# Patient Record
Sex: Female | Born: 1950
Health system: Southern US, Community
[De-identification: ages and names within clinical notes are randomized; demographics above are authoritative.]

## PROBLEM LIST (undated history)

## (undated) DIAGNOSIS — K219 Gastro-esophageal reflux disease without esophagitis: Secondary | ICD-10-CM

## (undated) DIAGNOSIS — E785 Hyperlipidemia, unspecified: Secondary | ICD-10-CM

## (undated) DIAGNOSIS — I080 Rheumatic disorders of both mitral and aortic valves: Secondary | ICD-10-CM

## (undated) DIAGNOSIS — K449 Diaphragmatic hernia without obstruction or gangrene: Secondary | ICD-10-CM

## (undated) DIAGNOSIS — R87619 Unspecified abnormal cytological findings in specimens from cervix uteri: Secondary | ICD-10-CM

## (undated) DIAGNOSIS — M8580 Other specified disorders of bone density and structure, unspecified site: Secondary | ICD-10-CM

## (undated) DIAGNOSIS — D126 Benign neoplasm of colon, unspecified: Secondary | ICD-10-CM

## (undated) DIAGNOSIS — Z923 Personal history of irradiation: Secondary | ICD-10-CM

## (undated) DIAGNOSIS — I341 Nonrheumatic mitral (valve) prolapse: Secondary | ICD-10-CM

## (undated) DIAGNOSIS — I10 Essential (primary) hypertension: Secondary | ICD-10-CM

## (undated) DIAGNOSIS — K5790 Diverticulosis of intestine, part unspecified, without perforation or abscess without bleeding: Secondary | ICD-10-CM

## (undated) DIAGNOSIS — I6529 Occlusion and stenosis of unspecified carotid artery: Secondary | ICD-10-CM

## (undated) DIAGNOSIS — C50919 Malignant neoplasm of unspecified site of unspecified female breast: Secondary | ICD-10-CM

## (undated) DIAGNOSIS — I351 Nonrheumatic aortic (valve) insufficiency: Secondary | ICD-10-CM

## (undated) DIAGNOSIS — E079 Disorder of thyroid, unspecified: Secondary | ICD-10-CM

## (undated) DIAGNOSIS — K760 Fatty (change of) liver, not elsewhere classified: Secondary | ICD-10-CM

## (undated) HISTORY — DX: Benign neoplasm of colon, unspecified: D12.6

## (undated) HISTORY — DX: Nonrheumatic aortic (valve) insufficiency: I35.1

## (undated) HISTORY — DX: Diverticulosis of intestine, part unspecified, without perforation or abscess without bleeding: K57.90

## (undated) HISTORY — PX: CRYOTHERAPY: SHX1416

## (undated) HISTORY — DX: Essential (primary) hypertension: I10

## (undated) HISTORY — DX: Malignant neoplasm of unspecified site of unspecified female breast: C50.919

## (undated) HISTORY — DX: Rheumatic disorders of both mitral and aortic valves: I08.0

## (undated) HISTORY — DX: Disorder of thyroid, unspecified: E07.9

## (undated) HISTORY — DX: Hyperlipidemia, unspecified: E78.5

## (undated) HISTORY — DX: Nonrheumatic mitral (valve) prolapse: I34.1

## (undated) HISTORY — DX: Diaphragmatic hernia without obstruction or gangrene: K44.9

## (undated) HISTORY — DX: Fatty (change of) liver, not elsewhere classified: K76.0

## (undated) HISTORY — DX: Occlusion and stenosis of unspecified carotid artery: I65.29

## (undated) HISTORY — DX: Personal history of irradiation: Z92.3

## (undated) HISTORY — DX: Unspecified abnormal cytological findings in specimens from cervix uteri: R87.619

## (undated) HISTORY — DX: Other specified disorders of bone density and structure, unspecified site: M85.80

## (undated) HISTORY — PX: PELVIC LAPAROSCOPY: SHX162

## (undated) HISTORY — DX: Gastro-esophageal reflux disease without esophagitis: K21.9

---

## 1997-08-15 ENCOUNTER — Other Ambulatory Visit: Admission: RE | Admit: 1997-08-15 | Discharge: 1997-08-15 | Payer: Self-pay | Admitting: Obstetrics and Gynecology

## 1998-08-14 ENCOUNTER — Other Ambulatory Visit: Admission: RE | Admit: 1998-08-14 | Discharge: 1998-08-14 | Payer: Self-pay | Admitting: Obstetrics and Gynecology

## 1999-10-26 ENCOUNTER — Encounter (INDEPENDENT_AMBULATORY_CARE_PROVIDER_SITE_OTHER): Payer: Self-pay

## 1999-10-26 ENCOUNTER — Other Ambulatory Visit: Admission: RE | Admit: 1999-10-26 | Discharge: 1999-10-26 | Payer: Self-pay | Admitting: Obstetrics and Gynecology

## 2000-01-11 ENCOUNTER — Encounter: Payer: Self-pay | Admitting: Obstetrics and Gynecology

## 2000-01-11 ENCOUNTER — Encounter: Admission: RE | Admit: 2000-01-11 | Discharge: 2000-01-11 | Payer: Self-pay | Admitting: Obstetrics and Gynecology

## 2000-01-27 ENCOUNTER — Other Ambulatory Visit: Admission: RE | Admit: 2000-01-27 | Discharge: 2000-01-27 | Payer: Self-pay | Admitting: Obstetrics and Gynecology

## 2000-06-02 ENCOUNTER — Other Ambulatory Visit: Admission: RE | Admit: 2000-06-02 | Discharge: 2000-06-02 | Payer: Self-pay | Admitting: Obstetrics and Gynecology

## 2000-10-27 ENCOUNTER — Other Ambulatory Visit: Admission: RE | Admit: 2000-10-27 | Discharge: 2000-10-27 | Payer: Self-pay | Admitting: Obstetrics and Gynecology

## 2001-11-02 ENCOUNTER — Other Ambulatory Visit: Admission: RE | Admit: 2001-11-02 | Discharge: 2001-11-02 | Payer: Self-pay | Admitting: Obstetrics and Gynecology

## 2003-01-17 ENCOUNTER — Encounter: Admission: RE | Admit: 2003-01-17 | Discharge: 2003-01-17 | Payer: Self-pay | Admitting: Obstetrics and Gynecology

## 2003-01-17 ENCOUNTER — Encounter: Payer: Self-pay | Admitting: Obstetrics and Gynecology

## 2003-01-23 ENCOUNTER — Other Ambulatory Visit: Admission: RE | Admit: 2003-01-23 | Discharge: 2003-01-23 | Payer: Self-pay | Admitting: Obstetrics and Gynecology

## 2004-03-18 ENCOUNTER — Encounter: Admission: RE | Admit: 2004-03-18 | Discharge: 2004-03-18 | Payer: Self-pay | Admitting: Obstetrics and Gynecology

## 2004-03-25 ENCOUNTER — Other Ambulatory Visit: Admission: RE | Admit: 2004-03-25 | Discharge: 2004-03-25 | Payer: Self-pay | Admitting: Obstetrics and Gynecology

## 2005-05-20 ENCOUNTER — Other Ambulatory Visit: Admission: RE | Admit: 2005-05-20 | Discharge: 2005-05-20 | Payer: Self-pay | Admitting: Obstetrics and Gynecology

## 2005-07-21 ENCOUNTER — Encounter: Admission: RE | Admit: 2005-07-21 | Discharge: 2005-07-21 | Payer: Self-pay | Admitting: Obstetrics and Gynecology

## 2005-08-08 ENCOUNTER — Encounter: Admission: RE | Admit: 2005-08-08 | Discharge: 2005-08-08 | Payer: Self-pay | Admitting: Obstetrics and Gynecology

## 2006-02-02 ENCOUNTER — Ambulatory Visit: Payer: Self-pay | Admitting: Internal Medicine

## 2006-02-10 ENCOUNTER — Encounter: Admission: RE | Admit: 2006-02-10 | Discharge: 2006-02-10 | Payer: Self-pay | Admitting: Obstetrics and Gynecology

## 2006-07-27 ENCOUNTER — Other Ambulatory Visit: Admission: RE | Admit: 2006-07-27 | Discharge: 2006-07-27 | Payer: Self-pay | Admitting: Obstetrics and Gynecology

## 2007-03-08 ENCOUNTER — Encounter: Admission: RE | Admit: 2007-03-08 | Discharge: 2007-03-08 | Payer: Self-pay | Admitting: Obstetrics and Gynecology

## 2007-09-20 ENCOUNTER — Other Ambulatory Visit: Admission: RE | Admit: 2007-09-20 | Discharge: 2007-09-20 | Payer: Self-pay | Admitting: Obstetrics and Gynecology

## 2007-09-20 ENCOUNTER — Encounter: Admission: RE | Admit: 2007-09-20 | Discharge: 2007-09-20 | Payer: Self-pay | Admitting: Obstetrics and Gynecology

## 2008-01-18 ENCOUNTER — Ambulatory Visit: Payer: Self-pay | Admitting: Obstetrics and Gynecology

## 2008-04-18 DIAGNOSIS — C50919 Malignant neoplasm of unspecified site of unspecified female breast: Secondary | ICD-10-CM

## 2008-04-18 HISTORY — PX: BREAST LUMPECTOMY: SHX2

## 2008-04-18 HISTORY — PX: BREAST SURGERY: SHX581

## 2008-04-18 HISTORY — DX: Malignant neoplasm of unspecified site of unspecified female breast: C50.919

## 2008-11-03 ENCOUNTER — Encounter: Admission: RE | Admit: 2008-11-03 | Discharge: 2008-11-03 | Payer: Self-pay | Admitting: Obstetrics and Gynecology

## 2008-11-04 ENCOUNTER — Encounter: Payer: Self-pay | Admitting: Obstetrics and Gynecology

## 2008-11-04 ENCOUNTER — Ambulatory Visit: Payer: Self-pay | Admitting: Obstetrics and Gynecology

## 2008-11-04 ENCOUNTER — Other Ambulatory Visit: Admission: RE | Admit: 2008-11-04 | Discharge: 2008-11-04 | Payer: Self-pay | Admitting: Obstetrics and Gynecology

## 2008-11-05 ENCOUNTER — Encounter (INDEPENDENT_AMBULATORY_CARE_PROVIDER_SITE_OTHER): Payer: Self-pay | Admitting: Diagnostic Radiology

## 2008-11-05 ENCOUNTER — Encounter: Admission: RE | Admit: 2008-11-05 | Discharge: 2008-11-05 | Payer: Self-pay | Admitting: Obstetrics and Gynecology

## 2008-11-11 ENCOUNTER — Encounter: Admission: RE | Admit: 2008-11-11 | Discharge: 2008-11-11 | Payer: Self-pay | Admitting: Obstetrics and Gynecology

## 2008-11-14 ENCOUNTER — Ambulatory Visit: Payer: Self-pay | Admitting: Oncology

## 2008-11-17 ENCOUNTER — Ambulatory Visit: Admission: RE | Admit: 2008-11-17 | Discharge: 2008-12-30 | Payer: Self-pay | Admitting: Radiation Oncology

## 2008-11-18 ENCOUNTER — Encounter: Payer: Self-pay | Admitting: Internal Medicine

## 2008-11-25 ENCOUNTER — Encounter: Admission: RE | Admit: 2008-11-25 | Discharge: 2008-11-25 | Payer: Self-pay | Admitting: Surgery

## 2008-11-27 ENCOUNTER — Encounter (INDEPENDENT_AMBULATORY_CARE_PROVIDER_SITE_OTHER): Payer: Self-pay | Admitting: Surgery

## 2008-11-27 ENCOUNTER — Ambulatory Visit (HOSPITAL_BASED_OUTPATIENT_CLINIC_OR_DEPARTMENT_OTHER): Admission: RE | Admit: 2008-11-27 | Discharge: 2008-11-27 | Payer: Self-pay | Admitting: Surgery

## 2008-11-27 ENCOUNTER — Encounter: Admission: RE | Admit: 2008-11-27 | Discharge: 2008-11-27 | Payer: Self-pay | Admitting: Surgery

## 2008-12-15 ENCOUNTER — Encounter: Payer: Self-pay | Admitting: Internal Medicine

## 2008-12-19 ENCOUNTER — Ambulatory Visit: Payer: Self-pay | Admitting: Oncology

## 2008-12-24 ENCOUNTER — Encounter: Payer: Self-pay | Admitting: Internal Medicine

## 2009-01-22 ENCOUNTER — Ambulatory Visit: Payer: Self-pay | Admitting: Oncology

## 2009-01-22 ENCOUNTER — Encounter: Payer: Self-pay | Admitting: Internal Medicine

## 2009-01-30 ENCOUNTER — Ambulatory Visit: Payer: Self-pay | Admitting: Internal Medicine

## 2009-02-13 ENCOUNTER — Encounter: Payer: Self-pay | Admitting: Internal Medicine

## 2009-02-13 ENCOUNTER — Ambulatory Visit: Payer: Self-pay | Admitting: Internal Medicine

## 2009-02-16 ENCOUNTER — Encounter: Payer: Self-pay | Admitting: Internal Medicine

## 2009-06-03 ENCOUNTER — Encounter: Admission: RE | Admit: 2009-06-03 | Discharge: 2009-06-03 | Payer: Self-pay | Admitting: Radiation Oncology

## 2009-11-12 ENCOUNTER — Encounter: Admission: RE | Admit: 2009-11-12 | Discharge: 2009-11-12 | Payer: Self-pay | Admitting: Surgery

## 2009-11-16 ENCOUNTER — Ambulatory Visit: Payer: Self-pay | Admitting: Obstetrics and Gynecology

## 2009-11-16 ENCOUNTER — Other Ambulatory Visit: Admission: RE | Admit: 2009-11-16 | Discharge: 2009-11-16 | Payer: Self-pay | Admitting: Obstetrics and Gynecology

## 2009-12-17 ENCOUNTER — Ambulatory Visit: Payer: Self-pay | Admitting: Obstetrics and Gynecology

## 2010-05-08 ENCOUNTER — Other Ambulatory Visit: Payer: Self-pay | Admitting: Radiation Oncology

## 2010-05-08 DIAGNOSIS — Z9889 Other specified postprocedural states: Secondary | ICD-10-CM

## 2010-05-09 ENCOUNTER — Encounter: Payer: Self-pay | Admitting: Obstetrics and Gynecology

## 2010-07-24 LAB — COMPREHENSIVE METABOLIC PANEL
Alkaline Phosphatase: 56 U/L (ref 39–117)
Calcium: 10 mg/dL (ref 8.4–10.5)
Chloride: 103 mEq/L (ref 96–112)
Creatinine, Ser: 0.86 mg/dL (ref 0.4–1.2)
GFR calc Af Amer: >60 mL/min (ref >60)
Total Bilirubin: 0.8 mg/dL (ref 0.3–1.2)

## 2010-07-24 LAB — CBC
MCHC: 34 g/dL (ref 30.0–36.0)
MCV: 97 fL (ref 78.0–100.0)
WBC: 5.7 10*3/uL (ref 4.0–10.5)

## 2010-07-24 LAB — URINALYSIS, ROUTINE W REFLEX MICROSCOPIC
Bilirubin Urine: NEGATIVE
Glucose, UA: NEGATIVE mg/dL
Hgb urine dipstick: NEGATIVE
Specific Gravity, Urine: 1.006 (ref 1.005–1.030)

## 2010-07-24 LAB — DIFFERENTIAL
Basophils Relative: 0 % (ref 0–1)
Eosinophils Relative: 4 % (ref 0–5)
Lymphocytes Relative: 40 % (ref 12–46)
Neutro Abs: 2.7 10*3/uL (ref 1.7–7.7)
Neutrophils Relative %: 48 % (ref 43–77)

## 2010-08-31 NOTE — Op Note (Signed)
NAME:  Brooke Wilkinson, Brooke Wilkinson             ACCOUNT NO.:  [id number]   MEDICAL RECORD NO.:  [id number]          PATIENT TYPE:  AMB   LOCATION:  DSC                          FACILITY:  [facility]   PHYSICIAN:  Currie Paris, M.D.DATE OF BIRTH:  1950-07-04   DATE OF PROCEDURE:  11/27/2008  DATE OF DISCHARGE:                               OPERATIVE REPORT   PREOPERATIVE DIAGNOSIS:  Carcinoma, right breast, upper outer quadrant,  clinical stage I.   POSTOPERATIVE DIAGNOSIS:  Carcinoma, right breast, upper outer quadrant,  clinical stage I.   OPERATION:  Needle-guided right lumpectomy with blue dye injection,  sentinel lymph node biopsy, and MammoSite CED placement.   SURGEON:  Currie Paris, MD   ANESTHESIA:  General.   CLINICAL HISTORY:  This is a 60 year old lady who wished to have a  lumpectomy for her newly diagnosed right breast cancer.  She is not a  candidate for possible postoperative MammoSite radiation, so we plan to  put a MammoSite CED and it appeared appropriate after the lumpectomy and  sentinel node evaluation.   DESCRIPTION OF PROCEDURE:  I saw the patient in the holding area and she  had no further questions.  We confirmed the surgery as noted above.  I  marked the right breast as the operative side.  I reviewed the mammogram  with guidewire placement films.   The patient was then taken to the operating room and after satisfactory  general anesthesia had been obtained, the time-out was done.  I then  injected 5 mL of dilute methylene blue subareolarly and massaged that  in.  A full prep and drape of the right breast was then done.   Using the Neoprobe, I identified a hot area in the axilla and made a  transverse incision and entered the axilla proper.  Medially, I saw a  bright blue lymph node with a tube right lymphatic blue lymphatics  leading into it and excised this.  It had counts of 6000.  Once that was  out, I could identify no other hot area, see no other  blue lymphatics  other than the ones that had been coming into.  The node removed and  palpate no abnormal lymph nodes.  Moist pack was placed while we waited  for pathology to look at the lymph node.   The guidewire was noted to go from about the 12 o'clock position and  tracked somewhat inferiorly and somewhat laterally.  I made a  curvilinear incision about a centimeter and half below the guidewire  entry site and divided about a centimeter of breast tissue and then  manipulated the guidewire into the wound.  I then took a wide excision  around the tract of the guidewire and as I was getting towards the tip  of the guidewire, I could palpate that.  I had gotten close to the tumor  along the inferior medial aspect of the lumpectomy.  I had backed up a  little bit and taken a little bit extra, but did expose the tumor as I  was coming around this area.   Once the specimen  was out, I inked it and we did a specimen mammogram  showing that the clip was in the area where I had gotten close.   I went back at this point and took an additional approximately 1 cm  thick piece of tissue directly adjacent to where the tumor had been  palpated close to the margin of the primary excision.  I therefore  believe that if this re-excision has a good negative margin, then unless  there is tumor other than the palpably abnormal area on the main  specimen, we should have adequate margins.   I made sure everything was dry.  I put some Marcaine in to help with  postop analgesia.   At this point, Dr. Colonel Bald reported that the lymph node was negative.  I  then checked that area for hemostasis, put some Marcaine in and closed  in layers with 3-0 Vicryl and 4-0 Monocryl subcuticular and at the end,  Dermabond.   I then made a small incision laterally in the skin and put the MammoSite  trocar through and into the site of the lumpectomy.  I easily threaded  the CED in.  This seemed to inflate nicely and I  thought would fill the  cavity.   The balloon was then deflated and the cavity closed in several layers  with 3-0 Vicryl to close the subcu with 4-0 Monocryl subcuticular.  I  then reinflated the balloon to 40 mL and it seemed to have good filling  of the cavity and about an 8 mm skin to balloon margin.   At this point, sterile dressings were applied.  The patient tolerated  the procedure well.  There were no operative complications.  All counts  were correct.      Currie Paris, M.D.  Electronically Signed     CJS/MEDQ  D:  11/27/2008  T:  11/27/2008  Job:  [id number]   cc:   Margy Clarks, MD  Rande Brunt. Eda Paschal, M.D.

## 2010-09-03 NOTE — Assessment & Plan Note (Signed)
[facility]                              CARDIOLOGY OFFICE NOTE   NAME:Naves, HOLLEIGH CRIHFIELD                    MRN:          [id number]  DATE:02/02/2006                            DOB:          [birth year]    IDENTIFICATION:  Ms. Char is a 60 year old who is referred for  dyslipidemia. She has no known history of coronary artery disease.   NOTE: The patient had a Cardiolite scan done in 2001 that showed normal  profusion, EF greater than 70%.   She has been known to have abnormal lipids. Note back in 2001, her LDL  actually was 137, HDL was 54. This was by Dr. Alwyn Ren, but then she has had  much higher levels since. The most recent value: Total cholesterol 252, HDL  of 48, LDL of 180 (February 2007).   On talking to the patient, she says for breakfast she will have a bran  muffin, low-fat yogurt; lunch is potato, sandwich; dinner is [employer].   She does not exercise much. She knows she should.   CURRENT MEDICATIONS:  1. Climara Pro patch.  2. Multivitamin.   ALLERGIES:  1. PENICILLIN.  2. ERYTHROMYCIN.  3. OTHER ANTIBIOTICS.   FAMILY HISTORY:  Father had a history of congestive heart failure, but was  an alcoholic. Mother had diabetes and diabetic complications and died of  TAD. Two sisters, both with increased cholesterol.   PAST MEDICAL HISTORY:  1. Allergies.  2. Dyslipidemia.   SOCIAL HISTORY:  The patient does not smoke, does not drink.   REVIEW OF SYSTEMS:  All systems reviewed. Noted occasional irregular pulse  in 2001, otherwise negative with the above problem except as noted.   PHYSICAL EXAMINATION:  The patient is in no distress. Blood pressure:  122/60. Pulse: 94. Weight: 122.  NECK: No bruits.  LUNGS:  Clear.  CARDIAC: Regular rate and rhythm, S1, S2. No S3, S4 or murmurs.  ABDOMEN: Benign.  EXTREMITIES: No edema.   12-lead EKG: Normal sinus rhythm, 94 beats per minute.   IMPRESSION:  Ms. Rosier is a  60 year old woman with dyslipidemia. Looking  at her diet, she actually eats fairly well.   She is very reluctant to consider medical therapy. I think it is not  unreasonable to get her exercising. I also would recommend benichol spread q  day to see if she can decrease cholesterol absorption.   She may indeed need a statin and  I have told her this. Again, with her age  she is about 1-2% per ten year risk for a cardiac event. I gave her the  American Heart Association site as well as Framingham profile.   I would like to have the patient begin her modification now with increased  exercise, diet control, Benichol and I would be happy to check lipid panels  in about eight months time to see how she is doing. I will see her back  after.    ______________________________  Pricilla Riffle, MD, [facility]    PVR/MedQ  DD: 02/06/2006  DT: 02/06/2006  Job #: [id number]   cc:  Daniel L. Eda Paschal, M.D.

## 2010-11-15 ENCOUNTER — Ambulatory Visit
Admission: RE | Admit: 2010-11-15 | Discharge: 2010-11-15 | Disposition: A | Discharge status: Home / Self Care | Payer: Commercial Indemnity | Source: Ambulatory Visit | Attending: Radiation Oncology | Admitting: Radiation Oncology

## 2010-11-15 DIAGNOSIS — Z9889 Other specified postprocedural states: Secondary | ICD-10-CM

## 2010-12-08 ENCOUNTER — Ambulatory Visit
Admission: RE | Admit: 2010-12-08 | Discharge: 2010-12-08 | Disposition: A | Discharge status: Home / Self Care | Payer: Commercial Indemnity | Source: Ambulatory Visit | Attending: Radiation Oncology | Admitting: Radiation Oncology

## 2010-12-08 ENCOUNTER — Other Ambulatory Visit: Payer: Self-pay | Admitting: Radiation Oncology

## 2010-12-08 DIAGNOSIS — Z853 Personal history of malignant neoplasm of breast: Secondary | ICD-10-CM

## 2010-12-08 DIAGNOSIS — Z9889 Other specified postprocedural states: Secondary | ICD-10-CM

## 2011-01-31 IMAGING — MG MM SCREEN MAMMOGRAM BILATERAL
4 series · 4 of 4 positions shown · non-contrast
Comparison: none

DG SCREEN MAMMOGRAM BILATERAL
Bilateral CC and MLO view(s) were taken.
Technologist: Arjold Sahatit

DIGITAL SCREENING MAMMOGRAM WITH CAD:
The breast tissue is heterogeneously dense.  A possible mass is noted in the right breast.  Spot 
compression views and possibly sonography are recommended for further evaluation.  In the left 
breast, no masses or malignant type calcifications are identified.  Compared with prior studies.
Images were processed with CAD.

[R CC]
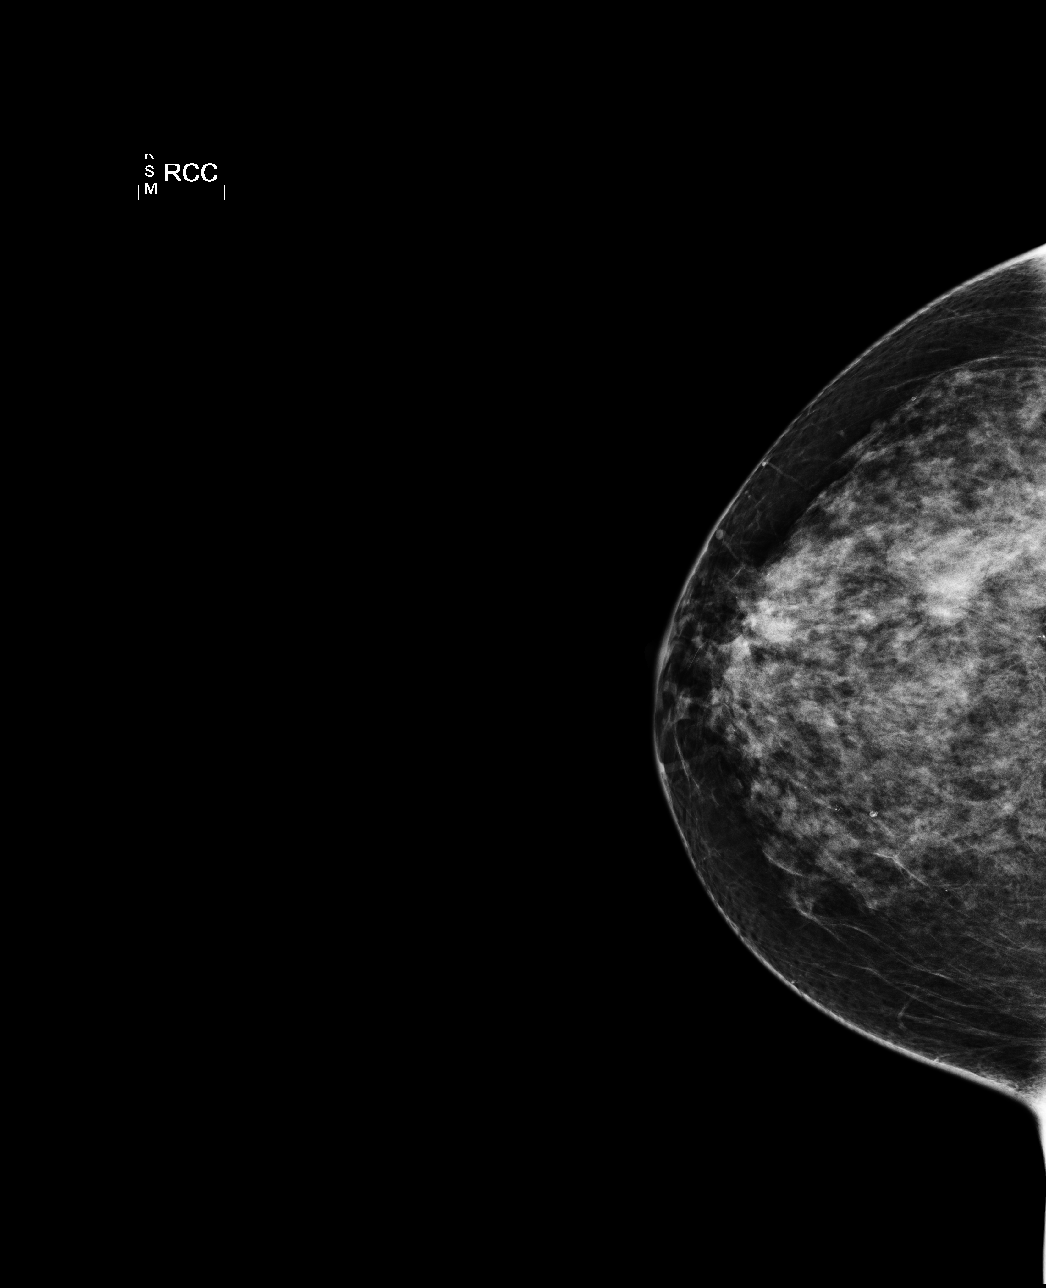

[L CC]
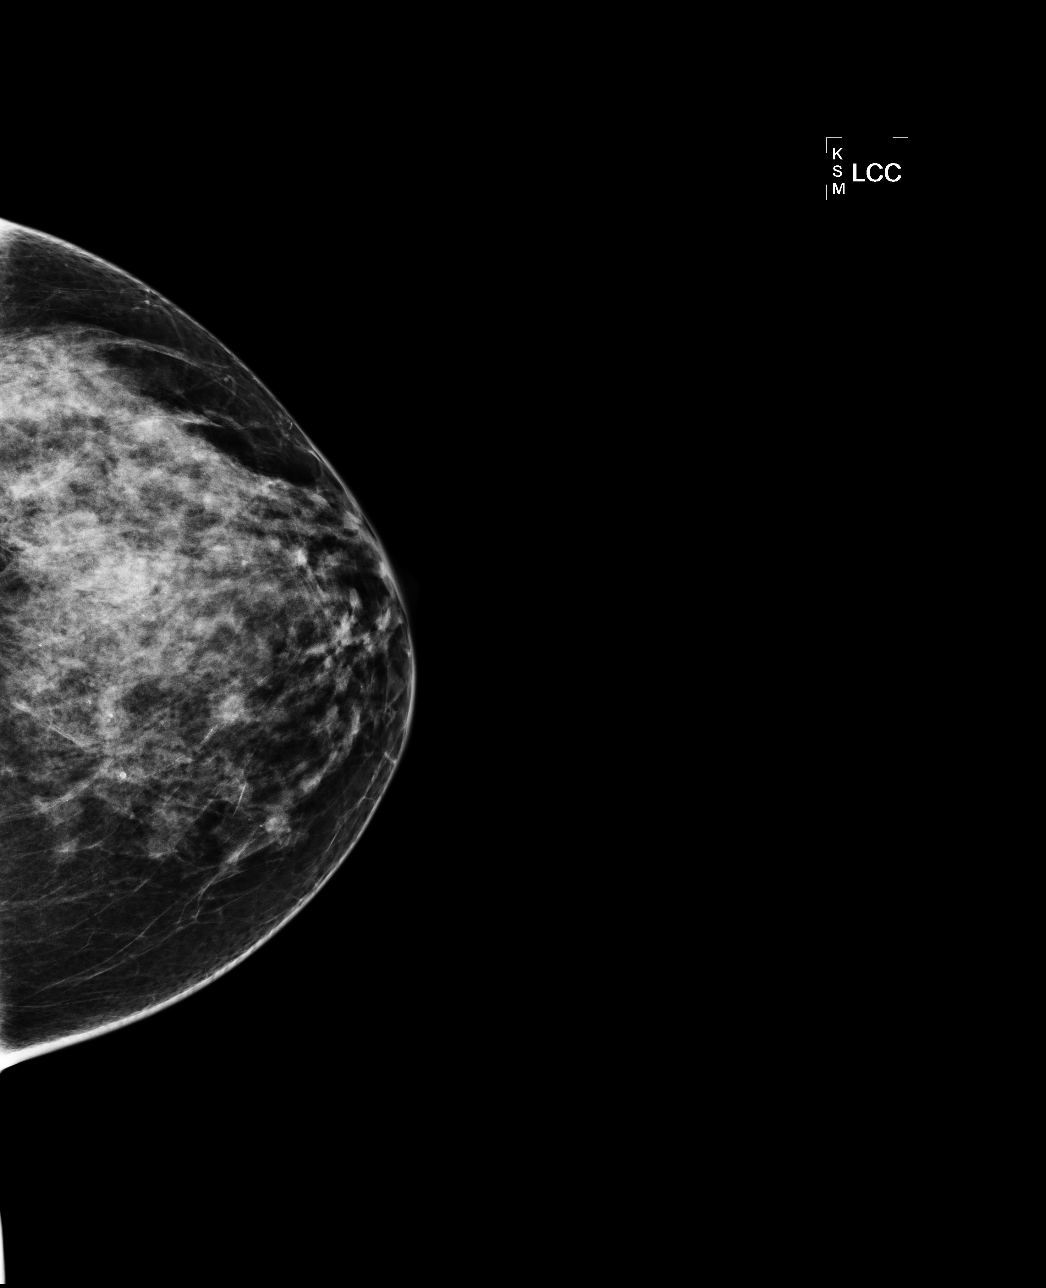

[L MLO]
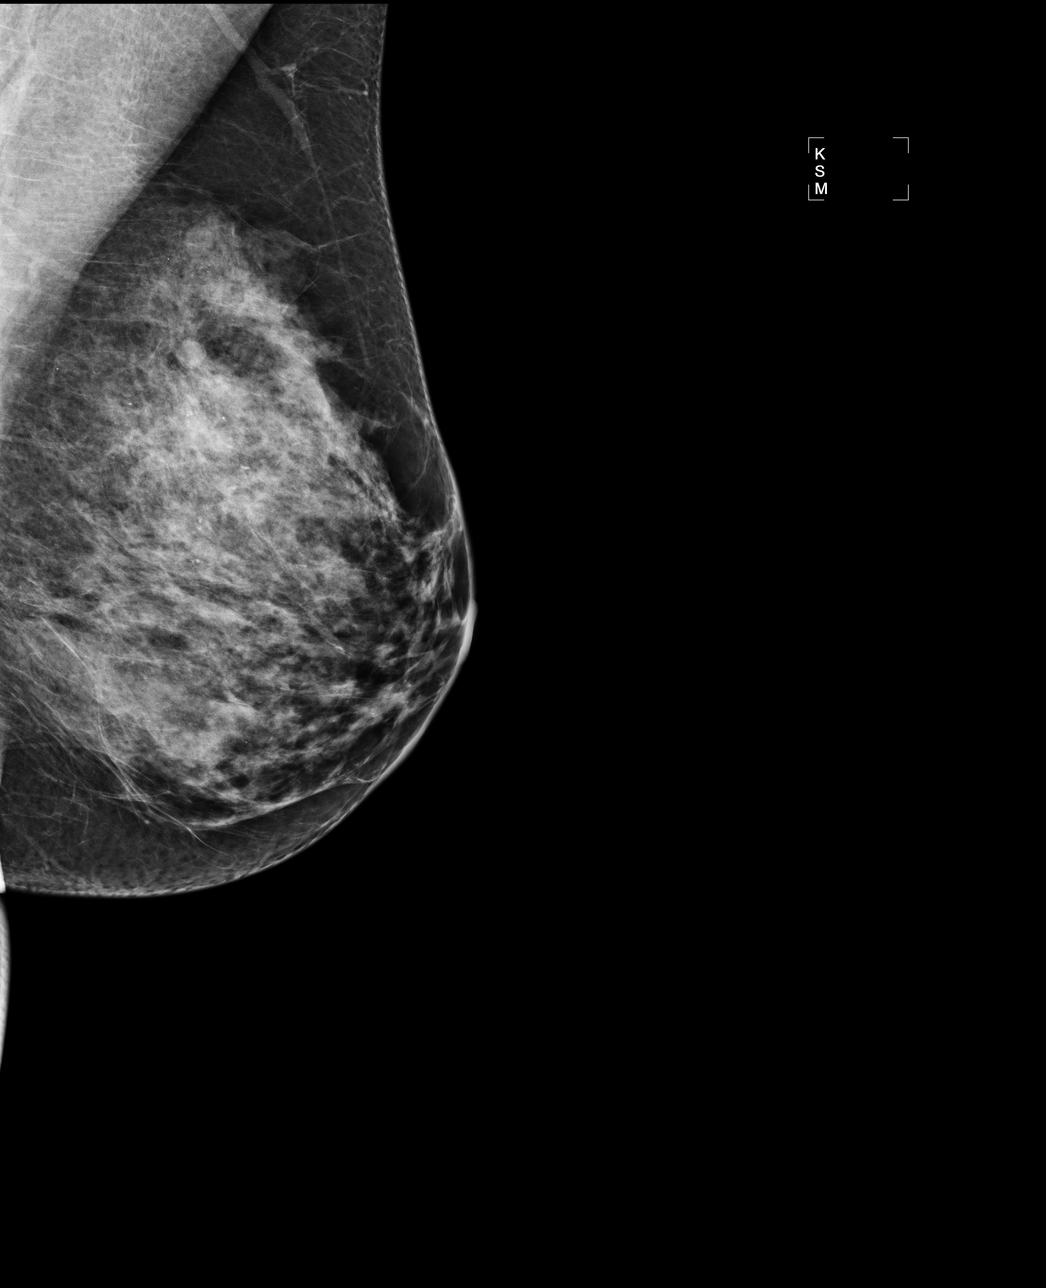

[R MLO]
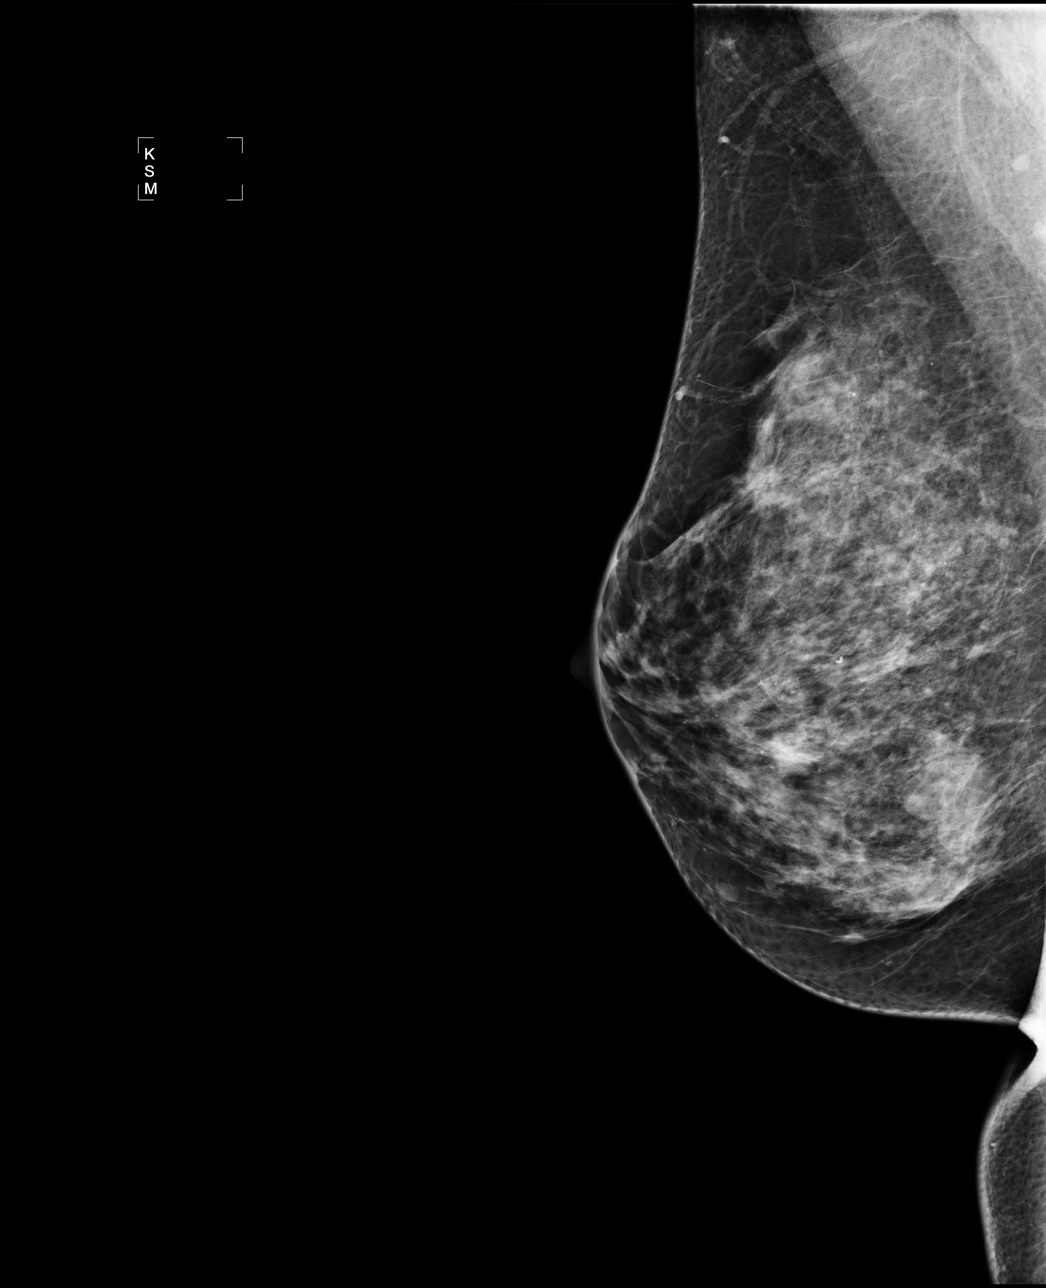

[4 of 4 positions shown; findings below may reference images not displayed]

IMPRESSION: Possible mass, right breast.  Additional evaluation is indicated.  The patient will be contacted 
for additional studies and a supplementary report will follow.  No specific mammographic evidence 
of malignancy, left breast.

ASSESSMENT: Need additional imaging evaluation and/or prior mammograms for comparison - BI-RADS 0

Further imaging of the right breast.
ANALYZED BY COMPUTER AIDED DETECTION. , THIS PROCEDURE WAS A DIGITAL MAMMOGRAM.

## 2011-02-02 IMAGING — US US BREAST R
1 series · 8 of 8 positions shown · non-contrast
Comparison: With priors

CLINICAL DATA: Abnormal right screening mammogram

DIGITAL DIAGNOSTIC  RIGHT  MAMMOGRAM   AND RIGHT BREAST
ULTRASOUND:

[Series 1: us breast right · 8 of 8 slices shown]
[im 1/8]
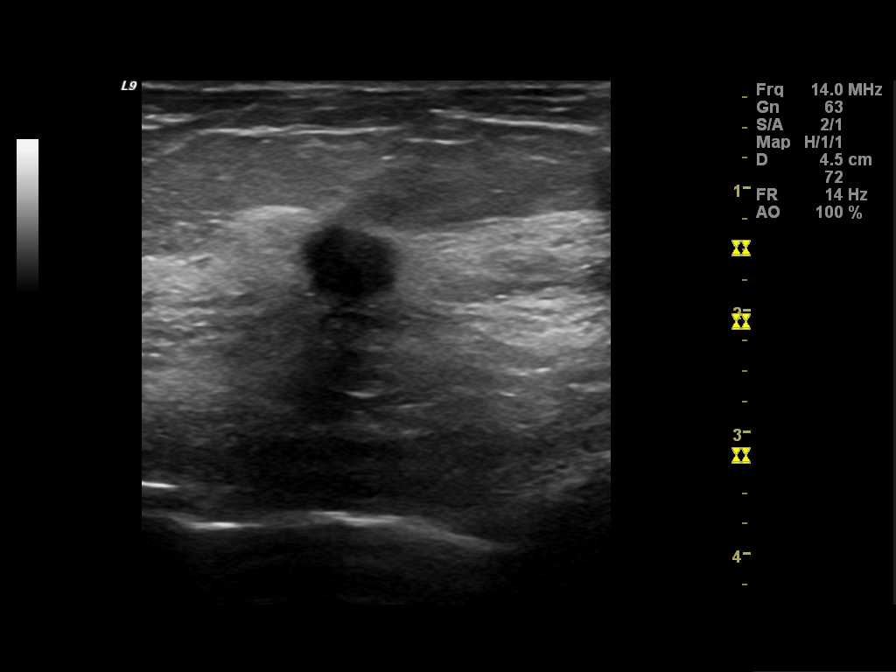
[im 2/8]
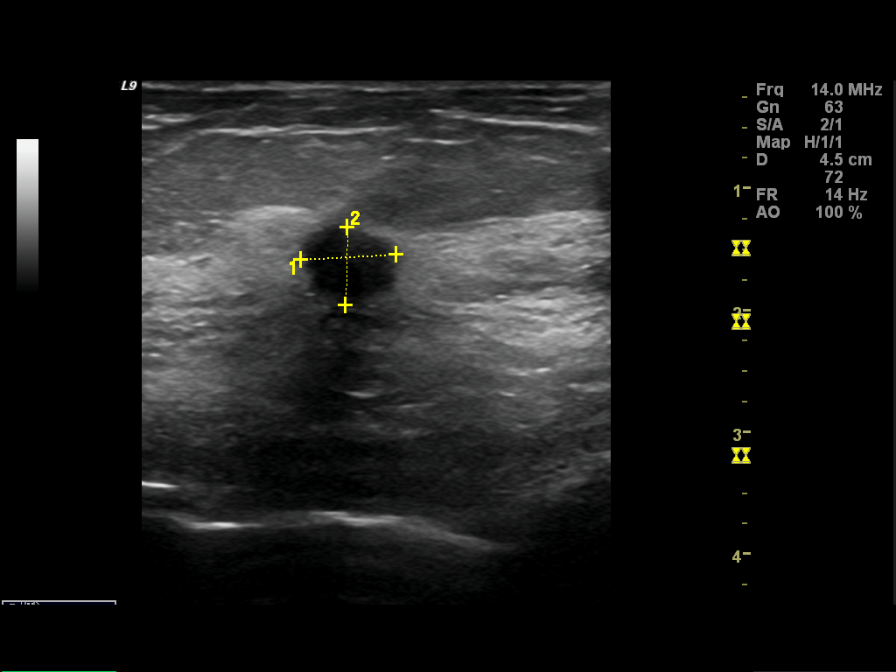
[im 3/8]
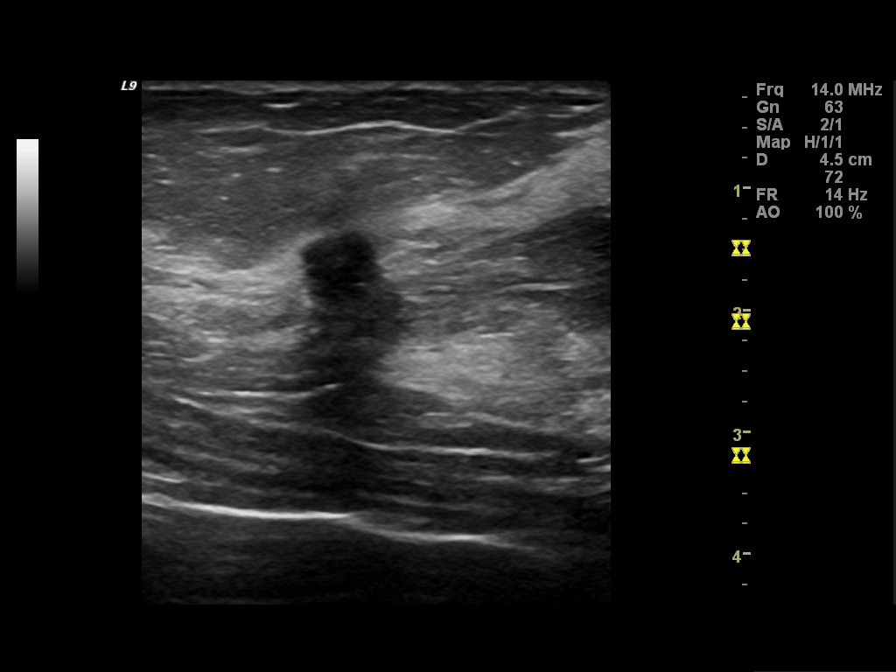
[im 4/8]
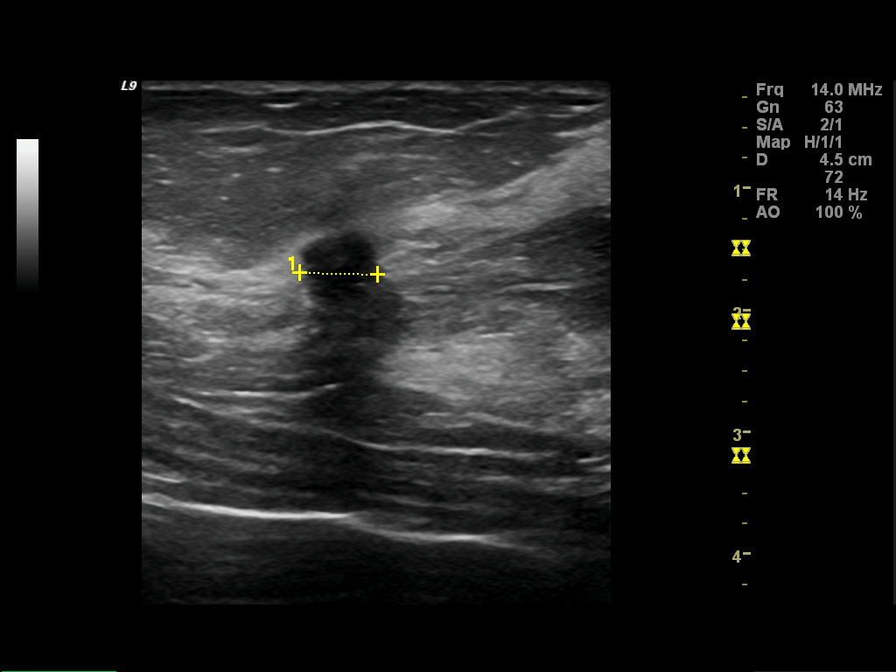
[im 5/8]
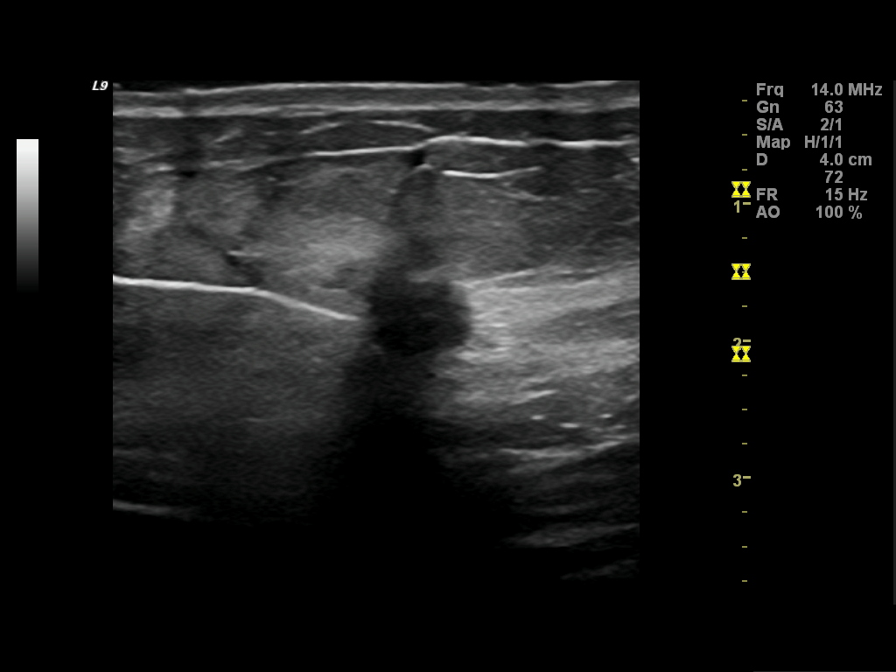
[im 6/8]
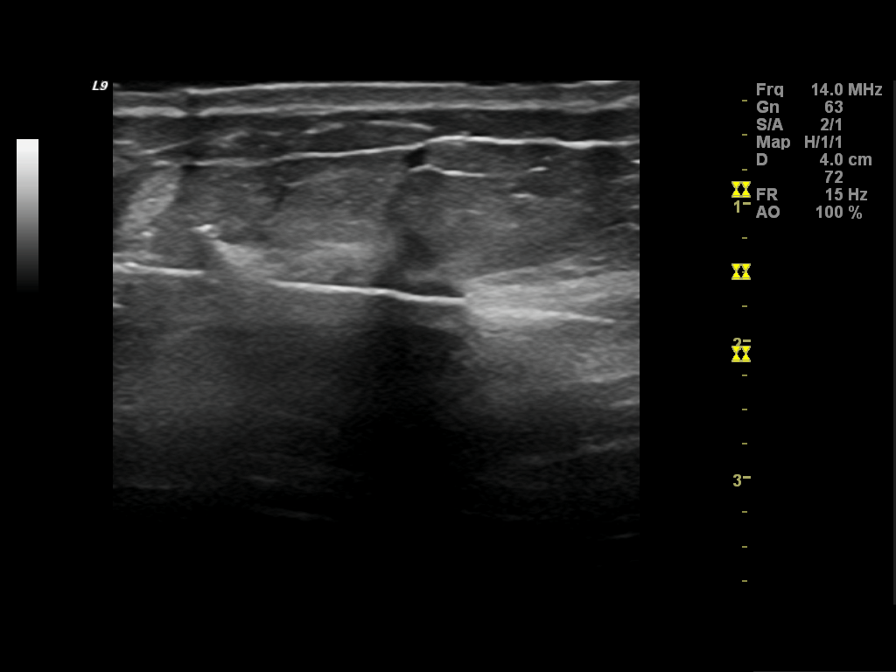
[im 7/8]
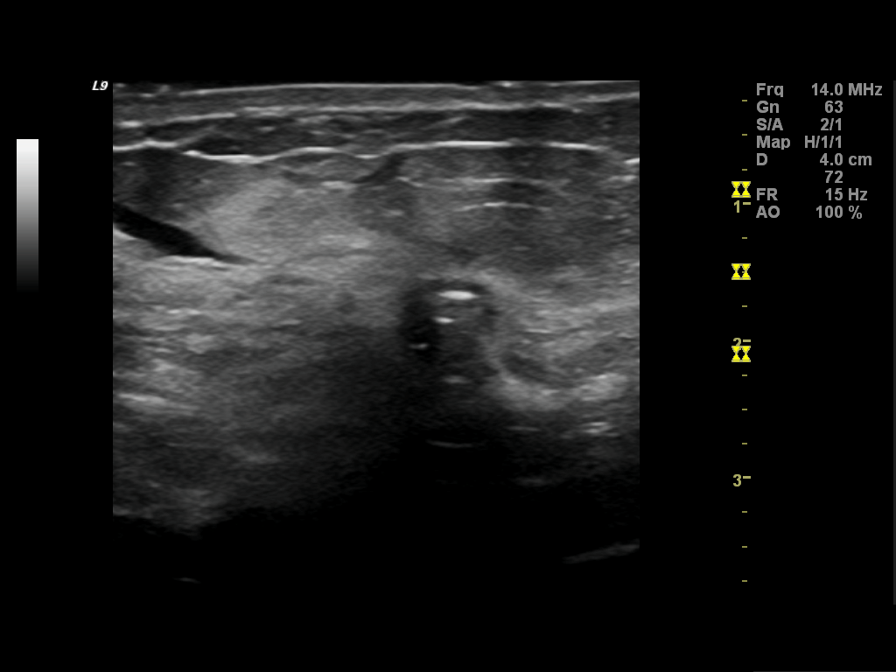
[im 8/8]
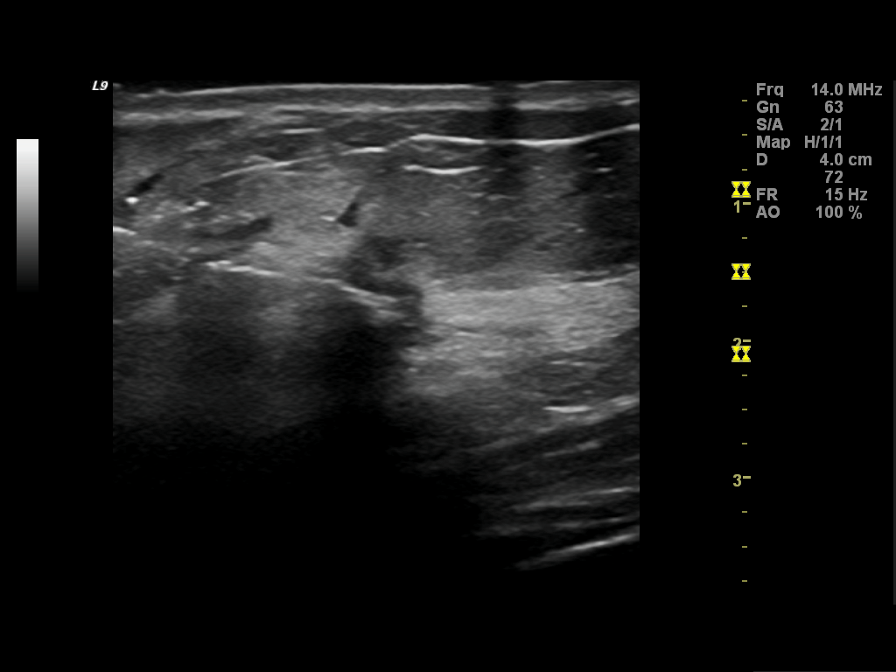

[8 of 8 positions shown; findings below may reference images not displayed]

FINDINGS: Spot compression views of the upper outer quadrant of the
right breast were performed.  There is persistent of a poorly
defined mass associated with distortion.  There are no malignant-
type microcalcifications.

On physical exam, I do not palpate a mass in the right breast.

Ultrasound is performed, showing there is a hypoechoic mass in the
right breast at 10 o'clock  2-3 cm from the nipple measuring 8 x 6
x 6 mm.  Sonographic evaluation of the right axilla fails to reveal
any adenopathy.
IMPRESSION: Suspicious mass, right breast.  Tissue sampling is recommended.
Ultrasound-guided core biopsy will be performed and dictated
separately.

BI-RADS CATEGORY 4:  Suspicious abnormality - biopsy should be
considered.

## 2011-02-02 IMAGING — MG MM DIAGNOSTIC UNILATERAL R
2 series · 2 of 2 positions shown · non-contrast
Comparison: With priors

CLINICAL DATA: Suspicious right breast mass.  The patient status
post biopsy.

DIGITAL DIAGNOSTIC RIGHT MAMMOGRAM

[R CC]
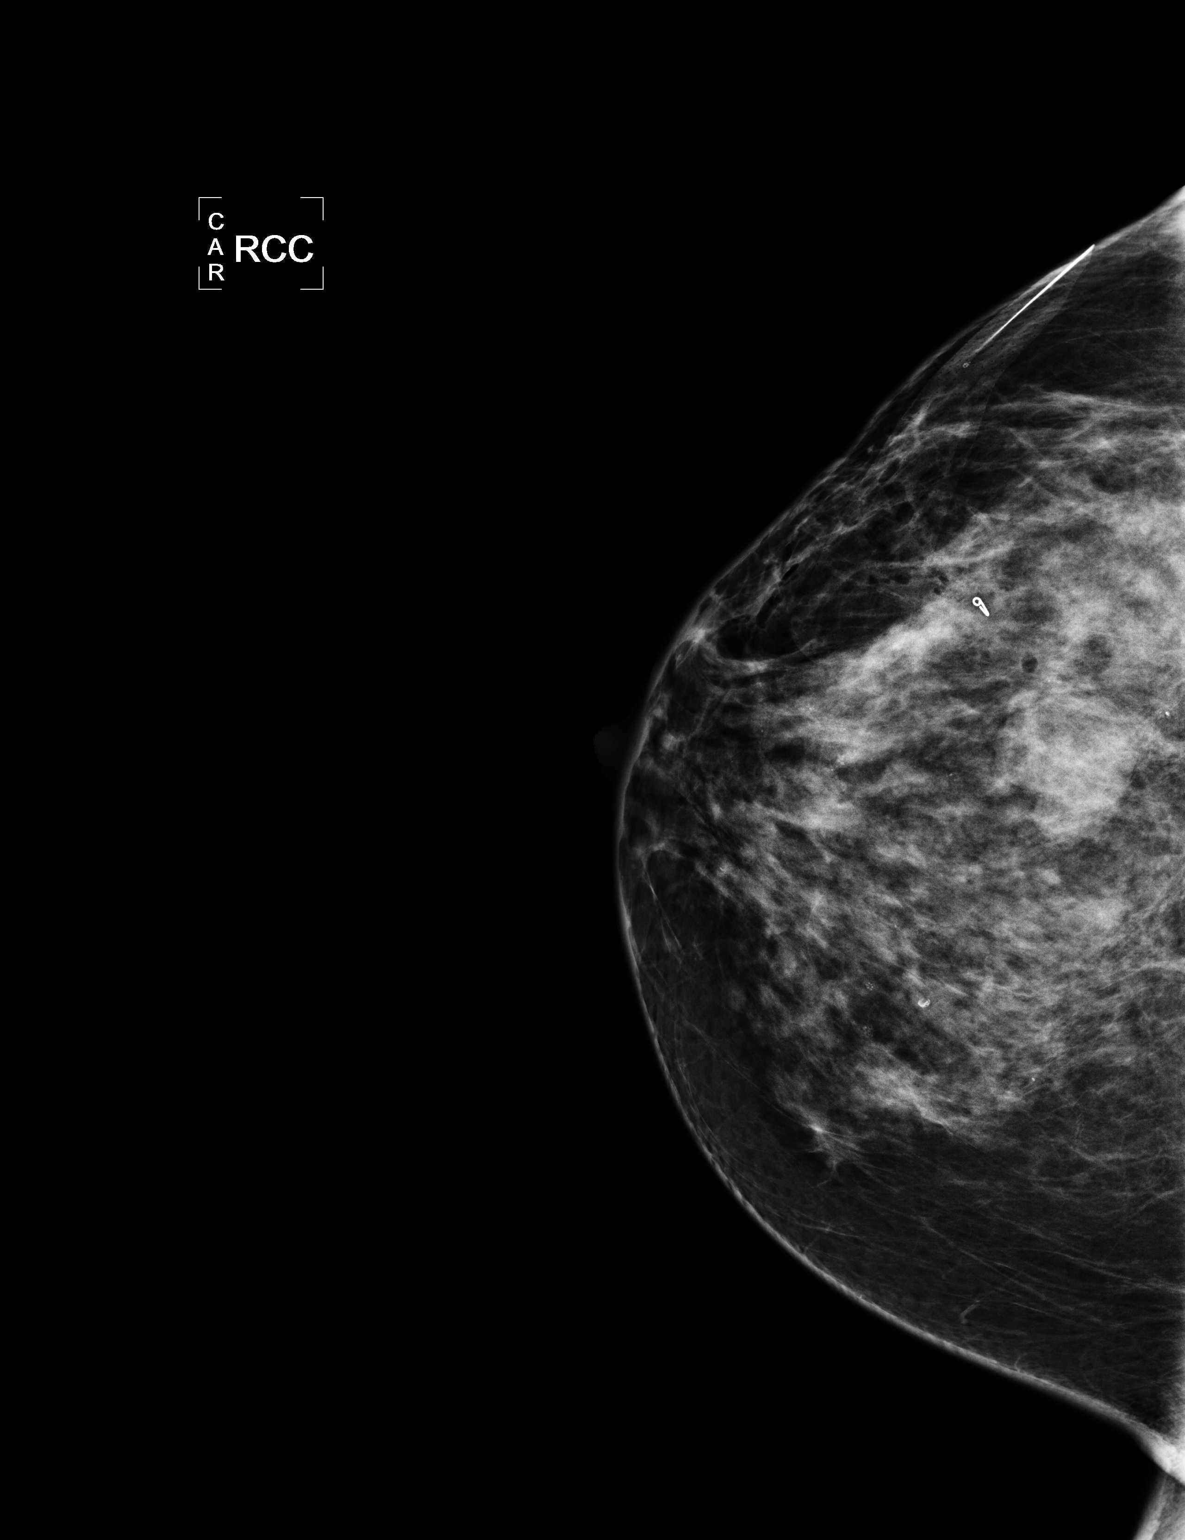

[R ML]
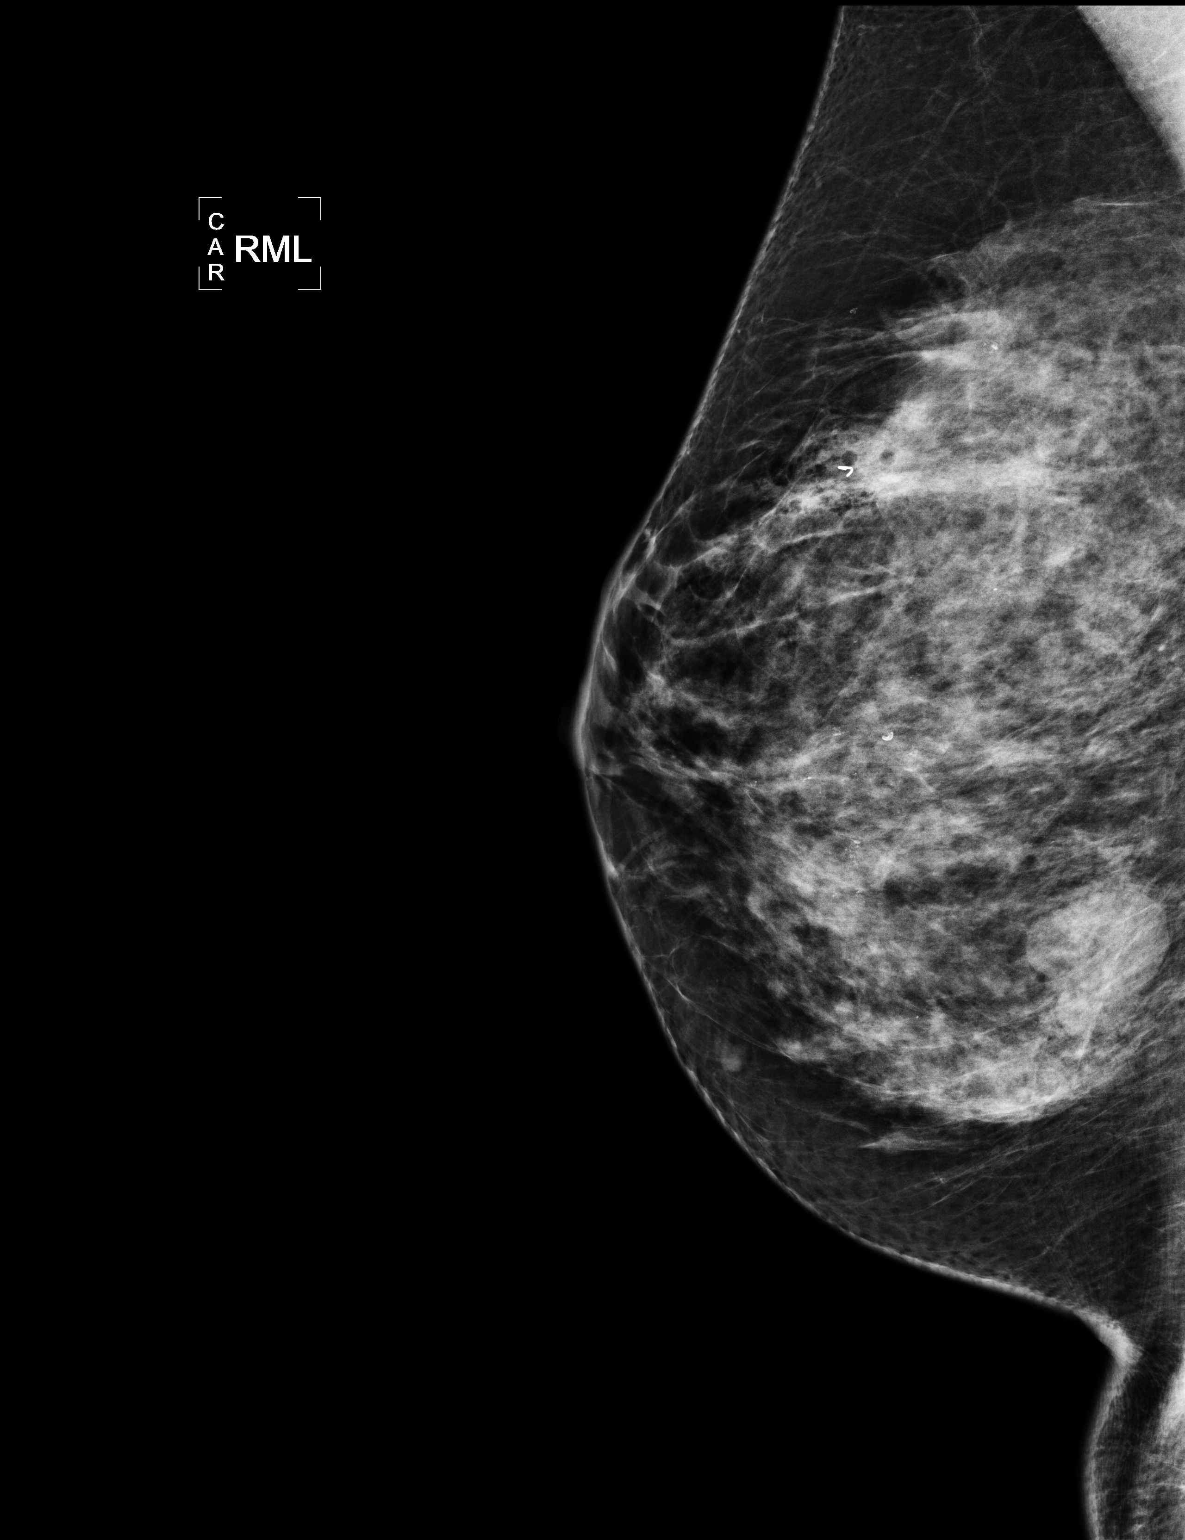

[2 of 2 positions shown; findings below may reference images not displayed]

FINDINGS: Films are performed following ultrasound guided biopsy
of the mass in the 10 o'clock region of the right breast.
Mammographic images demonstrate the clip is in the upper outer
quadrant of the right breast in good position.
IMPRESSION: The patient is status post ultrasound-guided core biopsy of the
right breast with pathology pending.

## 2011-02-21 ENCOUNTER — Encounter: Payer: Self-pay | Admitting: Gynecology

## 2011-02-21 DIAGNOSIS — C801 Malignant (primary) neoplasm, unspecified: Secondary | ICD-10-CM | POA: Insufficient documentation

## 2011-02-21 DIAGNOSIS — M8580 Other specified disorders of bone density and structure, unspecified site: Secondary | ICD-10-CM | POA: Insufficient documentation

## 2011-02-21 DIAGNOSIS — I341 Nonrheumatic mitral (valve) prolapse: Secondary | ICD-10-CM | POA: Insufficient documentation

## 2011-02-21 DIAGNOSIS — K635 Polyp of colon: Secondary | ICD-10-CM | POA: Insufficient documentation

## 2011-03-08 ENCOUNTER — Encounter: Payer: Self-pay | Admitting: Obstetrics and Gynecology

## 2011-03-08 ENCOUNTER — Other Ambulatory Visit (HOSPITAL_COMMUNITY)
Admission: RE | Admit: 2011-03-08 | Discharge: 2011-03-08 | Disposition: A | Discharge status: Home / Self Care | Payer: Commercial Indemnity | Source: Ambulatory Visit | Attending: Obstetrics and Gynecology | Admitting: Obstetrics and Gynecology

## 2011-03-08 ENCOUNTER — Ambulatory Visit (INDEPENDENT_AMBULATORY_CARE_PROVIDER_SITE_OTHER): Payer: Commercial Indemnity | Admitting: Obstetrics and Gynecology

## 2011-03-08 VITALS — BP 120/66 | Ht 65.5 in | Wt 130.0 lb

## 2011-03-08 DIAGNOSIS — Z780 Asymptomatic menopausal state: Secondary | ICD-10-CM

## 2011-03-08 DIAGNOSIS — Z01419 Encounter for gynecological examination (general) (routine) without abnormal findings: Secondary | ICD-10-CM

## 2011-03-08 DIAGNOSIS — M8580 Other specified disorders of bone density and structure, unspecified site: Secondary | ICD-10-CM

## 2011-03-08 DIAGNOSIS — N951 Menopausal and female climacteric states: Secondary | ICD-10-CM

## 2011-03-08 DIAGNOSIS — E039 Hypothyroidism, unspecified: Secondary | ICD-10-CM | POA: Insufficient documentation

## 2011-03-08 DIAGNOSIS — M949 Disorder of cartilage, unspecified: Secondary | ICD-10-CM

## 2011-03-08 NOTE — Progress Notes (Signed)
The patient came back to see me today for her annual GYN exam. She is doing well with her breast cancer without recurrence. She discussed  adjunctive therapy with the oncologists but elected not to take either and aromatase inhibitor or tamoxifen. She has an estrogen receptive positive breast cancer. Now that she stopped her HRT because of the breast cancer she is having both hot flashes and vaginal dryness. She has discussed this with her integrative medical physician in [place] and he was given a prescribe for her. She is up-to-date on mammograms. Her last bone density was last year and she is stable low bone mass without an elevated FRAX risk. She does her lab work elsewhere.  Physical examination: Kim gardner present HEENT within normal limits. Neck: Thyroid not large. No masses. Supraclavicular nodes: not enlarged. Breasts: Examined in both sitting midline position. No skin changes and no masses. Obvious divot in right breast at site of lumpectomy without mass Abdomen: Soft no guarding rebound or masses or hernia. Pelvic: External: Within normal limits. BUS: Within normal limits. Vaginal:within normal limits. Good estrogen effect. No evidence of cystocele rectocele or enterocele. Cervix: clean. Uterus: Normal size and shape. Adnexa: No masses. Rectovaginal exam: Confirmatory and negative. Extremities: Within normal limits.  Assessment: #1. Menopausal symptoms #2. Low bone mass #3. Breast cancer  Plan: Discussed vaginal estrogen. Discussed she called the medical oncologist to get their permission even though she no longer sees him. Discussed Estring, Vagifem, or estradiol vaginal cream 0.02%. She understands that the first 2 probably have less absorption of estrogen. She will call when necessary. She will continue periodic bone densities. She will continue yearly mammograms. She will ask the radiation therapist whether she needs MRI of breasts.

## 2011-05-05 DIAGNOSIS — K760 Fatty (change of) liver, not elsewhere classified: Secondary | ICD-10-CM

## 2011-05-05 HISTORY — DX: Fatty (change of) liver, not elsewhere classified: K76.0

## 2011-05-18 ENCOUNTER — Telehealth: Payer: Self-pay | Admitting: Internal Medicine

## 2011-05-18 NOTE — Telephone Encounter (Signed)
Spoke with patient and she is having reflux and dysphagia. States her PCP wants her to see GI MD. She will have her PCP send labs and ultrasound records. Scheduled patient on 05/25/11 at 9:45/10:00 AM with Dr. Juanda Chance.

## 2011-05-19 ENCOUNTER — Encounter: Payer: Self-pay | Admitting: *Deleted

## 2011-05-25 ENCOUNTER — Encounter: Payer: Self-pay | Admitting: Internal Medicine

## 2011-05-25 ENCOUNTER — Ambulatory Visit (INDEPENDENT_AMBULATORY_CARE_PROVIDER_SITE_OTHER): Payer: Commercial Indemnity | Admitting: Internal Medicine

## 2011-05-25 DIAGNOSIS — R1310 Dysphagia, unspecified: Secondary | ICD-10-CM

## 2011-05-25 DIAGNOSIS — K219 Gastro-esophageal reflux disease without esophagitis: Secondary | ICD-10-CM

## 2011-05-25 NOTE — Patient Instructions (Signed)
You have been scheduled for an endoscopy. Please follow written instructions given to you at your visit today.  

## 2011-05-25 NOTE — Progress Notes (Signed)
Brooke Wilkinson Jul 17, 1950 MRN [id number]   History of Present Illness:  This is a 61 year old white female with new onset gastroesophageal reflux which started in November 2012 and occurs during the day but mostly at night. The only change in her lifestyle has been eating her dinner late, about 2 hours before going to bed. She has gained about 10 pounds. She denies taking nonsteroidal agents or aspirin. She had focal radiation to the right breast for breast cancer in August 2010. She has been on a thyroid replacement since then. We saw her in October 2010 for a colonoscopy which showed 2 tubulovillous adenomas. Her recall colonoscopy is due in October 2013. She denies dysphagia. The pain is substernal and was evaluated in the emergency room were cardiac causes have been ruled out. She has tried taking antacids but has not tried any acid suppressing agents.   Past Medical History  Diagnosis Date  . Osteopenia   . MVP (mitral valve prolapse)     No antibiotics required for procedures  . Breast cancer     Right breast-Invasive ductal  . Adenomatous colon polyp   . Dyslipidemia   . Diverticulosis   . Thyroid disease   . GERD (gastroesophageal reflux disease)   . Fatty liver 05/05/2011    Ultrasound    Past Surgical History  Procedure Date  . Pelvic laparoscopy   . Breast surgery 2010    Lumpectomy    reports that she has never smoked. She has never used smokeless tobacco. She reports that she does not drink alcohol or use illicit drugs. family history includes Alcohol abuse in her father; Breast cancer in her maternal grandmother; Diabetes in her mother; Heart disease in her mother; Hypertension in her mother; and Osteoporosis in her sister.  There is no history of Colon cancer. Allergies  Allergen Reactions  . Penicillins     REACTION: RASH  . Tetracycline     REACTION: Raging Yeast Infections        Review of Systems: Positive for occasional dysphagia. Positive for chest  pain. Negative for change in bowel habits or rectal bleeding  The remainder of the 10 point ROS is negative except as outlined in H&P   Physical Exam: General appearance  Well developed, in no distress. Eyes- non icteric. HEENT nontraumatic, normocephalic. Mouth no lesions, tongue papillated, no cheilosis. Neck supple without adenopathy, thyroid not enlarged, no carotid bruits, no JVD. Lungs Clear to auscultation bilaterally. Right breast scars on her right axilla Cor normal S1, normal S2, regular rhythm, no murmur,  quiet precordium. Abdomen: Soft nontender abdomen with normal active bowel sounds. No tenderness. Liver edge at costal margin. Rectal: Soft Hemoccult negative stool Extremities no pedal edema. Skin no lesions. No overt radiation changes Neurological alert and oriented x 3. Psychological normal mood and affect.  Assessment and Plan:   Problem #1 New onset gastroesophageal reflux of 3 months duration. This is most likely related to a change in her daily schedule. This results in not having enough time to digest food before going to bed. Her weight is also a contributing factor. She has been under some stress recently. R/o radiation  Esophagitis or esophageal dis motility secondary to radiation. She does not want to take any proton pump inhibitors until the workup which will include an upper endoscopy and biopsies. We will rule out Barrett's esophagus. I have instructed her an antireflux measures.  05/25/2011 Lina Sar

## 2011-05-27 ENCOUNTER — Ambulatory Visit (AMBULATORY_SURGERY_CENTER): Payer: Commercial Indemnity | Admitting: Internal Medicine

## 2011-05-27 ENCOUNTER — Encounter: Payer: Self-pay | Admitting: Internal Medicine

## 2011-05-27 DIAGNOSIS — K219 Gastro-esophageal reflux disease without esophagitis: Secondary | ICD-10-CM

## 2011-05-27 DIAGNOSIS — D131 Benign neoplasm of stomach: Secondary | ICD-10-CM

## 2011-05-27 DIAGNOSIS — R1310 Dysphagia, unspecified: Secondary | ICD-10-CM

## 2011-05-27 DIAGNOSIS — K222 Esophageal obstruction: Secondary | ICD-10-CM

## 2011-05-27 MED ORDER — SODIUM CHLORIDE 0.9 % IV SOLN: 500.0000 mL | INTRAVENOUS | Status: DC

## 2011-05-27 MED ORDER — PANTOPRAZOLE SODIUM 40 MG PO TBEC: 40.0000 mg | DELAYED_RELEASE_TABLET | Freq: Every day | ORAL | Status: DC

## 2011-05-27 NOTE — Op Note (Signed)
[facility] [address] [place], [place]  [address]  ENDOSCOPY PROCEDURE REPORT  PATIENT:  Brooke Wilkinson, Brooke Wilkinson  MR#:  [id number] BIRTHDATE:  07-01-1950, 60 yrs. old  GENDER:  female  ENDOSCOPIST:  Hedwig Morton. Juanda Chance, MD Referred by:  Edyth Gunnels, M.D.  PROCEDURE DATE:  05/27/2011 PROCEDURE:  EGD with biopsy, 43239, Maloney Dilation of Esophagus ASA CLASS:  Class I INDICATIONS:  GERD, heartburn, dysphagia  MEDICATIONS:   MAC sedation, administered by CRNA, propofol (Diprivan) 150 mg TOPICAL ANESTHETIC:  Cetacaine Spray, none  DESCRIPTION OF PROCEDURE:   After the risks benefits and alternatives of the procedure were thoroughly explained, informed consent was obtained.  The LB GIF-H180 [id number] endoscope was introduced through the mouth and advanced to the second portion of the duodenum, without limitations.  The instrument was slowly withdrawn as the mucosa was fully examined. <<PROCEDUREIMAGES>>  A stricture was found. nonobstructing fibrous ring at g-e junction With standard forceps, a biopsy was obtained and sent to pathology (see image3 and image9). maloney dilator 28F maloney passes without difficulty  A hiatal hernia was found (see image10). 2 cm h h 35-37 cm  There were multiple polyps identified. With standard forceps, a biopsy was obtained and sent to pathology (see image8, image7, image5, and image6). fundic gland polyps  Otherwise the examination was normal (see image4).    Retroflexed views revealed no abnormalities.    The scope was then withdrawn from the patient and the procedure completed.  COMPLICATIONS:  None  ENDOSCOPIC IMPRESSION: 1) Stricture 2) Hiatal hernia 3) Polyps, multiple 4) Otherwise normal examination RECOMMENDATIONS: 1) Await biopsy results 2) Anti-reflux regimen to be follow Protonix 40 mg daily x 4 weeks, then reassess  REPEAT EXAM:  In 0 year(s) for.  ______________________________ Hedwig Morton. Juanda Chance,  MD  CC:  n. eSIGNED:   Hedwig Morton. Shamecca Whitebread at 05/27/2011 10:52 AM  Rudean Haskell, [id number]

## 2011-05-27 NOTE — Patient Instructions (Signed)
Please review discharge instructions (blue and green sheets)  Follow dilation diet today- see handout  Biopsies taken today- await pathology  Resume your normal medications today  Take Protonix 40mg  daily- 1 tablet 30 minutes before breakfast- for 4 weeks then reasses

## 2011-05-27 NOTE — Progress Notes (Signed)
Patient did not experience any of the following events: a burn prior to discharge; a fall within the facility; wrong site/side/patient/procedure/implant event; or a hospital transfer or hospital admission upon discharge from the facility. (G8907) Patient did not have preoperative order for IV antibiotic SSI prophylaxis. (G8918)  

## 2011-05-30 ENCOUNTER — Telehealth: Payer: Self-pay

## 2011-05-30 NOTE — Telephone Encounter (Signed)
Left message on answering machine. 

## 2011-05-31 ENCOUNTER — Encounter: Payer: Self-pay | Admitting: Internal Medicine

## 2011-11-23 ENCOUNTER — Ambulatory Visit: Payer: Commercial Indemnity | Admitting: Radiation Oncology

## 2011-12-12 ENCOUNTER — Telehealth: Payer: Self-pay | Admitting: *Deleted

## 2011-12-12 NOTE — Telephone Encounter (Signed)
Called patient to inform of test and fu visit, lvm for a return call 

## 2011-12-13 ENCOUNTER — Encounter: Payer: Self-pay | Admitting: Radiation Oncology

## 2011-12-13 NOTE — Progress Notes (Signed)
Ms. Rikard missed her scheduled mammogram appointment for earlier this month and canceled her FU appointment with me. After refusing to be rescheduled, I called her and she told me she was "not in a position" to have any more radiation "since I am trying to clear my body of radiation and other toxins". She is trying to "detoxify" her body by various means including chelation therapy. I let her know that she is not radioactive and a mammogram will not make her more radioactive. I told her that failing to return for mammography and FU with me or Dr. Jamey Ripa may result in a delay in diagnosing potential recurrent disease which could lead to metastatic cancer and death. She tells me she may reconsider mammography next year once she has been "detoxified". I let her know that I would be more than willing to reschedule her for mammography and see her in the future.

## 2011-12-27 ENCOUNTER — Ambulatory Visit: Payer: Commercial Indemnity | Admitting: Radiation Oncology

## 2012-06-13 ENCOUNTER — Other Ambulatory Visit: Payer: Self-pay | Admitting: Gynecology

## 2012-06-15 ENCOUNTER — Ambulatory Visit
Admission: RE | Admit: 2012-06-15 | Discharge: 2012-06-15 | Disposition: A | Discharge status: Home / Self Care | Payer: Commercial Indemnity | Source: Ambulatory Visit | Attending: Gynecology | Admitting: Gynecology

## 2012-06-15 ENCOUNTER — Other Ambulatory Visit: Payer: Self-pay | Admitting: Gynecology

## 2012-06-15 DIAGNOSIS — Z1231 Encounter for screening mammogram for malignant neoplasm of breast: Secondary | ICD-10-CM

## 2012-07-05 ENCOUNTER — Ambulatory Visit
Admission: RE | Admit: 2012-07-05 | Discharge: 2012-07-05 | Disposition: A | Discharge status: Home / Self Care | Payer: Commercial Indemnity | Source: Ambulatory Visit | Attending: Gynecology | Admitting: Gynecology

## 2012-07-05 DIAGNOSIS — Z853 Personal history of malignant neoplasm of breast: Secondary | ICD-10-CM

## 2012-07-05 MED ORDER — GADOBENATE DIMEGLUMINE 529 MG/ML IV SOLN
12.0000 mL | Freq: Once | INTRAVENOUS | Status: AC | PRN
  Administered 2012-07-05: 12 mL via INTRAVENOUS

## 2012-07-11 ENCOUNTER — Telehealth: Payer: Self-pay | Admitting: *Deleted

## 2012-07-11 NOTE — Telephone Encounter (Signed)
Message copied by Lorraine Lax on Wed Jul 11, 2012 10:12 AM ------      Message from: Douglass Rivers      Created: Thu Jul 05, 2012  7:44 PM       Inform pt MRI c/w previous surgical changes, no new findings, please get oncologist's name so it can be forwarded ------

## 2012-07-11 NOTE — Telephone Encounter (Signed)
LMTCB @ 10:15

## 2012-08-06 ENCOUNTER — Telehealth: Payer: Self-pay | Admitting: *Deleted

## 2012-08-06 ENCOUNTER — Encounter: Payer: Self-pay | Admitting: *Deleted

## 2012-08-06 NOTE — Telephone Encounter (Signed)
See Imaging report for 07/05/2012 MRI of right breast. May we take out of hold the report on chart and this patient out of hold in mammo book? sue

## 2012-08-06 NOTE — Telephone Encounter (Signed)
Out of hold mammogram per Dr. Farrel Gobble. sue

## 2013-02-14 ENCOUNTER — Encounter: Payer: Self-pay | Admitting: *Deleted

## 2013-02-14 DIAGNOSIS — Z923 Personal history of irradiation: Secondary | ICD-10-CM | POA: Insufficient documentation

## 2013-02-19 ENCOUNTER — Encounter: Payer: Self-pay | Admitting: Radiation Oncology

## 2013-02-19 ENCOUNTER — Ambulatory Visit
Admission: RE | Admit: 2013-02-19 | Discharge: 2013-02-19 | Disposition: A | Discharge status: Home / Self Care | Payer: Commercial Indemnity | Source: Ambulatory Visit | Attending: Radiation Oncology | Admitting: Radiation Oncology

## 2013-02-19 VITALS — BP 158/70 | HR 95 | Temp 97.8°F | Resp 20 | Wt 133.0 lb

## 2013-02-19 DIAGNOSIS — C801 Malignant (primary) neoplasm, unspecified: Secondary | ICD-10-CM

## 2013-02-19 NOTE — Progress Notes (Signed)
CC: Dr. Douglass Rivers  Followup note: [name] returns today approximately 4 years and 2 months following completion of MammoSite HDR brachytherapy following conservative surgery in the management of her T1c adenocarcinoma of the right breast. She missed her last scheduled followup with me back in the summer of 2013 at which time she also missed a mammogram appointment. She claimed that she was undergoing alternative therapies for detoxification, primarily to improve her health and for the development of "lymphedema" along her lateral right breast and lateral chest/axilla. She tells me  she was seeing a lymphedema specialist from [facility]. She describes an aching/"bruising" sensation along the lateral right breast, axilla, and lateral chest wall to the point that she is not comfortable having a right breast mammogram. Her pain does improve with nonsteroidal anti-inflammatory agents, however, she is concerned about her kidneys, and infrequently takes any NSAIDs. Dr. Farrel Gobble did get approval for breast MR which was performed back on March 20 of this year and this was without evidence for malignancy. A left breast mammogram on 06/15/2012 was unremarkable. She does have dense breast. She does not see Dr. Jamey Ripa, or Dr. Darnelle Catalan. Her only caretaker is Dr. Douglass Rivers, her GYN physician. She declined adjuvant tamoxifen as well.  Physical examination: Alert and oriented. She is in no distress. Filed Vitals:   02/19/13 1625  BP: 158/70  Pulse: 95  Temp: 97.8 F (36.6 C)  Resp: 20   Head and neck examination: Grossly unremarkable. Nodes: Without palpable cervical, supraclavicular, or axillary lymphadenopathy. Chest: Lungs clear. Breasts: There is a minor by defect along the upper-outer quadrant of her right breast at the side of her partial mastectomy and radiation therapy. There is minimal thickening as expected. No dominant masses are appreciated. I'm unable to appreciate, lateral right breast,  axillary, or chest wall edema. The lateral right breast, lower axilla and chest wall are slightly tender to palpation. Left breast without masses or lesions. Abdomen without hepatomegaly. Extremities: Without edema.  Impression: There is no evidence for recurrent disease within the right breast. Other than MRI, I do not have any other suggestions for screening/followup of her right breast. She may consider premedication with NSAIDs prior to right breast mammography.  Plan: I do suggest that she continue with mammography for followup and screening. I also suggest self breast examination. Followup visit with me in one year. Mammography will be scheduled see Dr. Douglass Rivers.

## 2013-02-19 NOTE — Progress Notes (Signed)
Pt states she has developed right breast pain, lymphedema which is in her underarm and right mid back. She states she has had lymphedema massage, and she exercises but continues to have this issue. She states she does not like to take medication regularly for this pain, but will take Aleve approximately twice a month to help her sleep. She c/o sleeping issues which cause her fatigue, loss of appetite.  Pt taking medication for her thyroid and is managing her acid reflux w/diet and life modifications. She states both these problems as well as her lymphedema occurred 2 years after completing radiation treatments. Pt has no FU w/Dr Magrinat and is not taking Tamoxifen.

## 2013-04-18 HISTORY — PX: BREAST BIOPSY: SHX20

## 2013-05-01 ENCOUNTER — Encounter: Payer: Self-pay | Admitting: Gynecology

## 2013-05-03 ENCOUNTER — Ambulatory Visit (INDEPENDENT_AMBULATORY_CARE_PROVIDER_SITE_OTHER): Payer: Commercial Indemnity | Admitting: Gynecology

## 2013-05-03 ENCOUNTER — Encounter: Payer: Self-pay | Admitting: Gynecology

## 2013-05-03 VITALS — BP 132/74 | HR 90 | Resp 16 | Wt 133.0 lb

## 2013-05-03 DIAGNOSIS — Z0000 Encounter for general adult medical examination without abnormal findings: Secondary | ICD-10-CM

## 2013-05-03 DIAGNOSIS — IMO0002 Reserved for concepts with insufficient information to code with codable children: Secondary | ICD-10-CM

## 2013-05-03 DIAGNOSIS — D0590 Unspecified type of carcinoma in situ of unspecified breast: Secondary | ICD-10-CM

## 2013-05-03 DIAGNOSIS — Z01419 Encounter for gynecological examination (general) (routine) without abnormal findings: Secondary | ICD-10-CM

## 2013-05-03 DIAGNOSIS — M792 Neuralgia and neuritis, unspecified: Secondary | ICD-10-CM

## 2013-05-03 DIAGNOSIS — Z13820 Encounter for screening for osteoporosis: Secondary | ICD-10-CM

## 2013-05-03 LAB — POCT URINALYSIS DIPSTICK
Leukocytes, UA: NEGATIVE
Urobilinogen, UA: NEGATIVE
pH, UA: 5

## 2013-05-03 MED ORDER — AMITRIPTYLINE HCL 25 MG PO TABS: 25.0000 mg | ORAL_TABLET | Freq: Every day | ORAL | Status: DC

## 2013-05-03 NOTE — Patient Instructions (Signed)

## 2013-05-03 NOTE — Progress Notes (Signed)
63 y.o. Married Caucasian female   G1P1001 here for annual exam. Pt reports menses are absent.  She does not report hot flashes, does not have night sweats, does have vaginal dryness.  She is using lubricants, Natural Cream .  She does not report post-menopasual bleeding.  Pt has issues with lymphedema on right after RT, she had MRI done 2014 and negative mammogram on left.  Pt had some paresthesia on right side as well that can interrupt her sleep  She is not interested in mammogram on right.  No LMP recorded. Patient is postmenopausal.          Sexually active: yes  The current method of family planning is post menopausal status.    Exercising: no Last pap: 04/30/12 NEG HR HPV Abnormal PAP: yes Mammogram: 06/15/12 Bi-Rads 1 BSE: yes  Colonoscopy: 01/2009 DEXA: 2009/2010 Alcohol:  1 glass of wine/month (socially) Tobacco: no  Labs: PCP ; Urine: Negative  Health Maintenance  Topic Date Due  . Tetanus/tdap  02/11/1969  . Zostavax  02/11/2010  . Influenza Vaccine  11/16/2012  . Pap Smear  04/30/2013  . Mammogram  06/15/2014  . Colonoscopy  01/17/2019    Family History  Problem Relation Age of Onset  . Diabetes Mother     Type 1  . Hypertension Mother   . Heart disease Mother   . Osteoporosis Sister   . Breast cancer Maternal Grandmother   . Colon cancer Neg Hx   . Alcohol abuse Father     Patient Active Problem List   Diagnosis Date Noted  . Hx of radiation therapy   . Hypothyroid 03/08/2011  . Osteopenia   . MVP (mitral valve prolapse)   . Cancer   . Benign colon polyp     Past Medical History  Diagnosis Date  . Osteopenia   . MVP (mitral valve prolapse)     No antibiotics required for procedures  . Breast cancer     Right breast-Invasive ductal  . Adenomatous colon polyp   . Dyslipidemia   . Diverticulosis   . Thyroid disease   . GERD (gastroesophageal reflux disease)   . Fatty liver 05/05/2011    Ultrasound   . Hx of radiation therapy 12/02/08- 12/08/08   right breast mammosite, 34 Gy 10 fractions, given twice daily  . Ductal carcinoma of breast, stage 1 2010    Past Surgical History  Procedure Laterality Date  . Pelvic laparoscopy    . Breast surgery  2010    Lumpectomy; Right- Mammocyte    Allergies: Erythromycin; Penicillins; and Tetracycline  Current Outpatient Prescriptions  Medication Sig Dispense Refill  . Ascorbic Acid (VITAMIN C PO) Take by mouth.        . B Complex Vitamins (VITAMIN B COMPLEX PO) Take by mouth.        . Cholecalciferol (VITAMIN D PO) Take 5,000 Units by mouth 2 (two) times daily.        Marland Kitchen KRILL OIL PO Take by mouth.        Marland Kitchen LIOTHYRONINE SODIUM PO Take 7.5 mg by mouth.      Marland Kitchen MAGNESIUM PO Take by mouth.      . Multiple Vitamin (MULTIVITAMIN) tablet Take 1 tablet by mouth daily.        Marland Kitchen NALTREXONE HCL PO Take by mouth.        . Probiotic Product (PROBIOTIC FORMULA PO) Take by mouth.        Marland Kitchen UNABLE TO FIND 12.5 mg.  Med Name:Iodoral      . VITAMIN K PO Take by mouth.      . liothyronine (CYTOMEL) 5 MCG tablet Take 5 mcg by mouth daily.        No current facility-administered medications for this visit.    ROS: Pertinent items are noted in HPI.  Exam:    BP 132/74  Pulse 90  Resp 16  Wt 133 lb (60.328 kg) Weight change: @WEIGHTCHANGE @ Last 3 height recordings:  Ht Readings from Last 3 Encounters:  05/27/11 5\' 5"  (1.651 m)  05/25/11 5\' 5"  (1.651 m)  03/08/11 5' 5.5" (1.664 m)   General appearance: alert, cooperative and appears stated age Head: Normocephalic, without obvious abnormality, atraumatic Neck: no adenopathy, no carotid bruit, no JVD, supple, symmetrical, trachea midline and thyroid not enlarged, symmetric, no tenderness/mass/nodules Lungs: clear to auscultation bilaterally Breasts: normal appearance, no masses or tenderness, on left, right with scarring from RT and biopsy Heart: regular rate and rhythm, S1, S2 normal, no murmur, click, rub or gallop Abdomen: soft, non-tender; bowel  sounds normal; no masses,  no organomegaly Extremities: extremities normal, atraumatic, no cyanosis or edema Skin: Skin color, texture, turgor normal. No rashes or lesions Lymph nodes: Cervical, supraclavicular, and axillary nodes normal. no inguinal nodes palpated Neurologic: Grossly normal   Pelvic: External genitalia:  no lesions              Urethra: normal appearing urethra with no masses, tenderness or lesions              Bartholins and Skenes: normal                 Vagina: normal appearing vagina with normal color and discharge, no lesions              Cervix: normal appearance              Pap taken: no        Bimanual Exam:  Uterus:  uterus is normal size, shape, consistency and nontender                                      Adnexa:    no masses                                      Rectovaginal: Confirms                                      Anus:  normal sphincter tone, no lesions  A: well woman Breast cancer s/p RT high dose with paresthesia on right now     P: mammogram on left Interested in trying elavil at bedtime to treat paresthesia and help with sleep counseled on breast self exam, adequate intake of calcium and vitamin D, diet and exercise return annually or prn Discussed PAP guideline changes, importance of weight bearing exercises, calcium, vit D and balanced diet.  An After Visit Summary was printed and given to the patient.

## 2013-05-07 ENCOUNTER — Encounter: Payer: Self-pay | Admitting: Gynecology

## 2013-05-08 ENCOUNTER — Telehealth: Payer: Self-pay | Admitting: Gynecology

## 2013-05-08 NOTE — Telephone Encounter (Signed)
Follow up from elevated heart rate that she experienced after taking elavil and cytomel.  Pt states that it took about about 24h for it to all come out of her system and she feels back to normal this am.  She will not be taking the elavil and will instead try massage and exercise for her lymphedema and neuropathy.

## 2013-06-12 ENCOUNTER — Encounter: Payer: Self-pay | Admitting: Gynecology

## 2013-08-04 ENCOUNTER — Encounter: Payer: Self-pay | Admitting: Gynecology

## 2013-08-05 ENCOUNTER — Telehealth: Payer: Self-pay

## 2013-08-05 NOTE — Telephone Encounter (Signed)
Call to patient ad notified message reviewed by Dr Sabra Heck in Dr Lathrop's absence.  Appointment scheduled with Dr lathrop for 100 tomorrow. Patient agreeable.  Routing to provider for final review. Patient agreeable to disposition. Will close encounter  Just FYI.

## 2013-08-05 NOTE — Telephone Encounter (Signed)
Message     Please call pt and let her know that Dr. Charlies Constable has been out of town for a few days. She needs an appt. If there is anything available tomorrow, please put her on Dr. Brion Aliment schedule.        MSM    ----- Message -----    From: Brooke Wilkinson    Sent: 08/04/2013 1:35 PM    To: [facility] Clinical Pool    Subject: Non-Urgent Medical Question         Since I saw you in January my lumpectomy scar has begun to "pull" inward and has developed a red spot that feels like there is scar tissue under it. Could you advise me which doctor I need to see to check it. That area stays sore and swells at times from the lymphedema but has not been red since a few months after the Mamosite radiation treatments. Thank you for your time and advise.

## 2013-08-06 ENCOUNTER — Encounter: Payer: Self-pay | Admitting: Gynecology

## 2013-08-06 ENCOUNTER — Ambulatory Visit (INDEPENDENT_AMBULATORY_CARE_PROVIDER_SITE_OTHER): Payer: BC Managed Care – PPO | Admitting: Gynecology

## 2013-08-06 VITALS — BP 150/86 | HR 88 | Resp 18 | Wt 133.0 lb

## 2013-08-06 DIAGNOSIS — Z853 Personal history of malignant neoplasm of breast: Secondary | ICD-10-CM

## 2013-08-06 DIAGNOSIS — Z923 Personal history of irradiation: Secondary | ICD-10-CM

## 2013-08-06 NOTE — Progress Notes (Signed)
Subjective:     Patient ID: Brooke Wilkinson, female   DOB: 1950-12-12, 63 y.o.   MRN: [id number]  HPI Comments: Pt here reporting a change in her scar from her breast cancer-lumpectomy.  Pt noticed about 2-3 ago, it has grown in size, it is red.  Denies any itching or burning.  Pt is right handed and has been doing a lot of lifting and activities on the right.  Incidental finding.  Pt finished RT   Pt first thought it was related to lymph and tried to massage.  The area feels numb.    Review of Systems  Constitutional: Negative for fever, chills, fatigue and unexpected weight change.  Skin: Positive for color change and wound.       Objective:   Physical Exam  Nursing note and vitals reviewed. Constitutional: She is oriented to person, place, and time. She appears well-developed and well-nourished.  Pulmonary/Chest: Right breast exhibits mass and skin change. Right breast exhibits no nipple discharge and no tenderness. Left breast exhibits no inverted nipple, no mass, no nipple discharge, no skin change and no tenderness.    Lymphadenopathy:    She has no axillary adenopathy.       Right axillary: No pectoral and no lateral adenopathy present.       Right: No supraclavicular adenopathy present.       Left: No supraclavicular adenopathy present.  Neurological: She is alert and oriented to person, place, and time.       Assessment:     History of breast cancer No follow up after RT with skin change     Plan:     Pt not interested in mammogram due to lymphedema and pain Referral to [facility] to see and treat Pt agreeable to plan

## 2013-08-07 ENCOUNTER — Encounter: Payer: Self-pay | Admitting: Gynecology

## 2013-08-07 ENCOUNTER — Telehealth: Payer: Self-pay | Admitting: Gynecology

## 2013-08-07 NOTE — Telephone Encounter (Signed)
Spoke with patient. Advised that she is scheduled w/ Dr Donne Hazel on Monday, May 18 @ 1600. Patient agreeable.

## 2013-08-08 NOTE — Progress Notes (Signed)
Brooke Wilkinson, Brooke Wilkinson MRN: [id number] Date: 09/02/2013 Status: Sch Time: 4:10 PM Length: 10 Visit Type: EST PATIENT NEW PROBLEM [[id number]] Copay: $0.00 Provider: Rolm Bookbinder, MD  Patient scheduled at [place] as above and patient aware.

## 2013-08-09 ENCOUNTER — Telehealth (INDEPENDENT_AMBULATORY_CARE_PROVIDER_SITE_OTHER): Payer: Self-pay | Admitting: General Surgery

## 2013-08-09 NOTE — Telephone Encounter (Signed)
Dr. Charlies Constable called in to see if we could move this appt up for this patient.  As of now she is scheduled specifically with Dr. Donne Hazel on 09/02/13 but Dr. Charlies Constable feels as though it needs to be seen earlier d/t it having acute changes at the scar with redness and dimpling that is worsening.  Informed her that his schedule is booked up at the moment but that I would send this message to his assistant and Dr. Donne Hazel to see if they would be willing to work her in somewhere.  Informed her that if we could we would let them know... [phone].

## 2013-08-09 NOTE — Telephone Encounter (Signed)
I called [facility] to see about getting pt an earlier appt, spoke with triage nurse, explained the recent change in pt's breast in area of prior biopsy and RT.  She states that she will reach out to Dr Donne Hazel to see about work in sooner and will let us know if there is an earlier possiblity

## 2013-08-10 NOTE — Telephone Encounter (Signed)
Ill see her somewhere this week if I can. Wherever Im not operating

## 2013-08-13 ENCOUNTER — Encounter: Payer: Self-pay | Admitting: Gynecology

## 2013-08-13 NOTE — Telephone Encounter (Signed)
Called pt to offer her an earlier appt with Dr Donne Hazel. I want to r/s the pt's appt from 5/18 to 4/29 arrive at 8am. The pt advised me that she is traveling out of town right now for a family emergency and will not be back till tomorrow night. The pt said she would of jumped on this appt but has to go take care of a family member that has had a stroke. I advised the pt to call me when she gets back in town and maybe we can work a different time again. The pt understands.

## 2013-08-14 ENCOUNTER — Telehealth (INDEPENDENT_AMBULATORY_CARE_PROVIDER_SITE_OTHER): Payer: Self-pay | Admitting: General Surgery

## 2013-08-14 NOTE — Telephone Encounter (Signed)
Patient stated that she is back in town now and wants to be seen sooner with Dr Donne Hazel. She is schedule to see Dr Donne Hazel on 09-02-13. The best number to call her back is on her cell

## 2013-08-16 ENCOUNTER — Encounter: Payer: Self-pay | Admitting: Gynecology

## 2013-08-16 NOTE — Telephone Encounter (Signed)
Called pt back to offer her an appt for 08/21/13 arrive at 2:30/3:00 to see Dr Donne Hazel. The pt is good with the new appt. I r/s the appt from 5/18.

## 2013-08-21 ENCOUNTER — Other Ambulatory Visit (INDEPENDENT_AMBULATORY_CARE_PROVIDER_SITE_OTHER): Payer: Self-pay | Admitting: General Surgery

## 2013-08-21 ENCOUNTER — Ambulatory Visit
Admission: RE | Admit: 2013-08-21 | Discharge: 2013-08-21 | Disposition: A | Discharge status: Home / Self Care | Payer: BC Managed Care – PPO | Source: Ambulatory Visit | Attending: General Surgery | Admitting: General Surgery

## 2013-08-21 ENCOUNTER — Ambulatory Visit (INDEPENDENT_AMBULATORY_CARE_PROVIDER_SITE_OTHER): Payer: BC Managed Care – PPO | Admitting: General Surgery

## 2013-08-21 ENCOUNTER — Encounter (INDEPENDENT_AMBULATORY_CARE_PROVIDER_SITE_OTHER): Payer: Self-pay | Admitting: General Surgery

## 2013-08-21 VITALS — BP 130/80 | HR 70 | Resp 16 | Ht 66.0 in | Wt 131.0 lb

## 2013-08-21 DIAGNOSIS — C50419 Malignant neoplasm of upper-outer quadrant of unspecified female breast: Secondary | ICD-10-CM

## 2013-08-21 NOTE — Progress Notes (Signed)
Patient ID: Brooke Wilkinson, female   DOB: 08-14-1950, 63 y.o.   MRN: [id number]  Chief Complaint  Patient presents with  . Other    Eval right lumpecty site    HPI Brooke Wilkinson is a 63 y.o. female.  Referred by Dr Elveria Rising HPI 57 yof who underwent in 2010 a right breast lumpectomy/sn followed by mammosite.  This was after abnormal mammogram.  She is hr positive but decided not to do antiestogens.  Since then she has had some swelling laterally with some discomfort.  She does state there was a burn from her mammosite.  She has some scarring issues and the lumpectomy scar has begun turning in.  She was taking a supplement called connexin for connective tissue. She has provided the info to me on the contents.  Nothing in there looks to cause a problem.  After that she developed a numb, hard, red area inferior to her scar.  This has maybe gotten a little better since she noticed it but is still present.  She follows up with Dr Valere Dross annually.  She had mr last year that results are below.  She cannot get mm on right due to pain.  Left side at [facility] was ok recently.  She does not note any other changes.    Past Medical History  Diagnosis Date  . Osteopenia   . MVP (mitral valve prolapse)     No antibiotics required for procedures  . Breast cancer     Right breast-Invasive ductal  . Adenomatous colon polyp   . Dyslipidemia   . Diverticulosis   . Thyroid disease   . GERD (gastroesophageal reflux disease)   . Fatty liver 05/05/2011    Ultrasound   . Hx of radiation therapy 12/02/08- 12/08/08    right breast mammosite, 34 Gy 10 fractions, given twice daily  . Ductal carcinoma of breast, stage 1 2010    Past Surgical History  Procedure Laterality Date  . Pelvic laparoscopy    . Breast surgery  2010    Lumpectomy; Right- Mammocyte    Family History  Problem Relation Age of Onset  . Diabetes Mother     Type 1  . Hypertension Mother   . Heart disease Mother   . Osteoporosis  Sister   . Breast cancer Maternal Grandmother   . Colon cancer Neg Hx   . Alcohol abuse Father     Social History History  Substance Use Topics  . Smoking status: Never Smoker   . Smokeless tobacco: Never Used  . Alcohol Use: No    Allergies  Allergen Reactions  . Erythromycin     Stomach Distress  . Penicillins     REACTION: RASH  . Tetracycline     REACTION: Raging Yeast Infections    Current Outpatient Prescriptions  Medication Sig Dispense Refill  . Ascorbic Acid (VITAMIN C PO) Take by mouth.        . B Complex Vitamins (VITAMIN B COMPLEX PO) Take by mouth.        . Cholecalciferol (VITAMIN D PO) Take 5,000 Units by mouth 2 (two) times daily.        Marland Kitchen KRILL OIL PO Take by mouth.        . liothyronine (CYTOMEL) 5 MCG tablet Take 7.5 mcg by mouth daily.       Marland Kitchen MAGNESIUM PO Take by mouth.      . Multiple Vitamin (MULTIVITAMIN) tablet Take 1 tablet by mouth daily.        Marland Kitchen  NALTREXONE HCL PO Take 4.5 mg by mouth daily.       . Probiotic Product (PROBIOTIC FORMULA PO) Take by mouth.        Marland Kitchen UNABLE TO FIND 12.5 mg. Med Name:Iodoral      . VITAMIN K PO Take by mouth.       No current facility-administered medications for this visit.    Review of Systems Review of Systems  Constitutional: Negative for fever, chills and unexpected weight change.  HENT: Negative for congestion, hearing loss, sore throat, trouble swallowing and voice change.   Eyes: Negative for visual disturbance.  Respiratory: Negative for cough and wheezing.   Cardiovascular: Negative for chest pain, palpitations and leg swelling.  Gastrointestinal: Negative for nausea, vomiting, abdominal pain, diarrhea, constipation, blood in stool, abdominal distention and anal bleeding.  Genitourinary: Negative for hematuria, vaginal bleeding and difficulty urinating.  Musculoskeletal: Negative for arthralgias.  Skin: Negative for rash and wound.  Neurological: Negative for seizures, syncope and headaches.    Hematological: Negative for adenopathy. Does not bruise/bleed easily.  Psychiatric/Behavioral: Negative for confusion.    Blood pressure 130/80, pulse 70, resp. rate 16, height 5\' 6"  (1.676 m), weight 131 lb (59.421 kg).  Physical Exam Physical Exam  Vitals reviewed. Constitutional: She appears well-developed and well-nourished.  Pulmonary/Chest: Right breast exhibits mass and skin change. Right breast exhibits no inverted nipple, no nipple discharge and no tenderness.      Data Reviewed BILATERAL BREAST MRI WITH AND WITHOUT CONTRAST  Technique: Multiplanar, multisequence MR images of both breasts  were obtained prior to and following the intravenous administration  of 82ml of Multihance. Three dimensional images were evaluated at  the independent DynaCad workstation.  Comparison: left mammogram 06/15/12 (right mammogram precluded by  severe right lymph edema); bilateral breast mri 11/11/08; 11/15/10  mammography  Findings: There is mild background parenchymal enhancment.  The right breast shows mild periareolar skin edema, but otherwise  does not demonstrate the lymph edema anticipated based on clinical  history. There is anticipated right breast scarring with a small  chronic postoperative seroma related to prior surgery. There is no  suspicious enhancement or mass on the right.  On the left, there is an approximately 6 mm axillary tail lymph  node, which is seen on all prior mammograms. It is stable from  prior MRI. There is no suspicious mass or enhancement on the left.  IMPRESSION:  Ancticipated postoperative appearance of the right breast, with  mild periareolar skin edema. No suspicious findings.  BI-RADS CATEGORY 2: Benign finding(s).    Assessment    History stage I breast cancer ? Local recurrence    Plan    We need to rule out local recurrence.  Will send her for [place] today.  She cannot get mm due to pain.  If something present on [place] will have this biopsied. If  negative then will return and I will do a punch biopsy of the affected skin.       Rolm Bookbinder 08/21/2013, 3:27 PM

## 2013-08-22 ENCOUNTER — Encounter (INDEPENDENT_AMBULATORY_CARE_PROVIDER_SITE_OTHER): Payer: Self-pay | Admitting: General Surgery

## 2013-08-22 ENCOUNTER — Ambulatory Visit (INDEPENDENT_AMBULATORY_CARE_PROVIDER_SITE_OTHER): Payer: BC Managed Care – PPO | Admitting: General Surgery

## 2013-08-22 VITALS — BP 142/82 | HR 84 | Resp 18 | Ht 66.0 in | Wt 132.0 lb

## 2013-08-22 DIAGNOSIS — N63 Unspecified lump in unspecified breast: Secondary | ICD-10-CM

## 2013-08-22 DIAGNOSIS — N6310 Unspecified lump in the right breast, unspecified quadrant: Secondary | ICD-10-CM

## 2013-08-22 NOTE — Addendum Note (Signed)
Addended by: Illene Regulus on: 08/22/2013 03:45 PM   Modules accepted: Orders

## 2013-08-22 NOTE — Progress Notes (Signed)
Subjective:     Patient ID: Brooke Wilkinson, female   DOB: June 10, 1950, 63 y.o.   MRN: [id number]  HPI 59 yof who I saw yesterday.  [place] is below.  She returns today for punch biopsy  Review of Systems EXAM:  DIGITAL DIAGNOSTIC left MAMMOGRAM WITH CAD  ULTRASOUND right BREAST  COMPARISON: Prior exams  ACR Breast Density Category c: The breast tissue is heterogeneously  dense, which may obscure small masses.  FINDINGS:  No evidence for malignancy is identified in the left breast.  Mammographic images were processed with CAD.  On physical exam, I palpate a firm erythematous mass in the right  breast 10 o'clock location.  Ultrasound is performed, showing focal skin thickening at the site  of the palpable mass measuring 4 mm in maximal thickness, with a few  foci of fluid versus edema within the skin itself but no underlying  intramammary collection or mass. Subjacent presumed postsurgical  scarring is noted.  IMPRESSION:  Focal skin thickening and trace fluid or edema in the right breast  10 o'clock location at the site of the palpable erythematous mass.  Local skin recurrence or less likely delayed radiation change could  have this appearance. Per discussion with Dr. Donne Hazel prior to  today's exam, dermal punch biopsy is planned.     Objective:   Physical Exam Unchanged since yesterday    Assessment:     Breast cancer ? Local recurrence     Plan:     We discussed next step and performed a punch biopsy.  I cleansed the area, anesthetized with lidocaine and did a 6 mm punch biopsy.  I closed this with a 4-0 nylon.  Will remove stitch in one week. Will base next step on path results.  If negative will consider repeat mr as she cannot get mm on that side although this is not replacement for mm

## 2013-08-26 ENCOUNTER — Telehealth (INDEPENDENT_AMBULATORY_CARE_PROVIDER_SITE_OTHER): Payer: Self-pay

## 2013-08-26 NOTE — Telephone Encounter (Signed)
Called pt back to let her know that I have called pathology just now but the path is still not ready yet. The slides have stains but not a reading on the slides yet. I notified the pt that it will be tomorrow before she gets a report. I will keep checking on the report and call her once I see it tomorrow. The pt understands.

## 2013-08-26 NOTE — Telephone Encounter (Signed)
Message copied by Illene Regulus on Mon Aug 26, 2013  4:12 PM ------      Message from: Aviva Signs      Created: Mon Aug 26, 2013  3:42 PM       Pt would like a call back with her path results please..[phone] ------

## 2013-08-27 NOTE — Telephone Encounter (Signed)
Called pt to give her the path of the punch bx rt br incision that shows fibrosis consistent with inflammation of the scar, no malignancy. I advised pt that Dr Donne Hazel has not seen the report yet b/c he is in surgery now. I advised pt that I would send Dr Donne Hazel this message to see what he would like for [place] to do next. The pt still has the nylon suture in place from the bx she asked if she could clip the suture herself but I said I would have to ask. Also the pt does not have a f/u appt scheduled again. I can make her a f/u appt the last week of May if this is ok with Dr Donne Hazel. Please advise.

## 2013-08-29 NOTE — Telephone Encounter (Signed)
LMOM giving her a f/u appt to see Dr Donne Hazel for 5/18 arrive 2:15/2:30.

## 2013-09-02 ENCOUNTER — Ambulatory Visit (INDEPENDENT_AMBULATORY_CARE_PROVIDER_SITE_OTHER): Payer: BC Managed Care – PPO | Admitting: General Surgery

## 2013-09-02 ENCOUNTER — Encounter (INDEPENDENT_AMBULATORY_CARE_PROVIDER_SITE_OTHER): Payer: Self-pay | Admitting: General Surgery

## 2013-09-02 ENCOUNTER — Encounter (INDEPENDENT_AMBULATORY_CARE_PROVIDER_SITE_OTHER): Payer: BC Managed Care – PPO | Admitting: General Surgery

## 2013-09-02 VITALS — BP 136/88 | HR 70 | Temp 98.6°F | Resp 16 | Ht 66.0 in | Wt 132.0 lb

## 2013-09-02 DIAGNOSIS — L538 Other specified erythematous conditions: Secondary | ICD-10-CM

## 2013-09-02 DIAGNOSIS — L539 Erythematous condition, unspecified: Secondary | ICD-10-CM

## 2013-09-02 NOTE — Progress Notes (Signed)
Subjective:     Patient ID: Brooke Wilkinson, female   DOB: October 09, 1950, 63 y.o.   MRN: [id number]  HPI 59 yof who underwent in 2010 a right breast lumpectomy/sn followed by mammosite. This was after abnormal mammogram. She is hr positive but decided not to do antiestogens. Since then she has had some swelling laterally with some discomfort. She does state there was a burn from her mammosite. She has some scarring issues and the lumpectomy scar has begun turning in. I have done [place] as documented previously. I then did a punch biopsy with result being fibrosis and inflammation.  No malignancy. She comes back today and she thinks this area is better and smaller   Review of Systems     Objective:   Physical Exam Healed incision, inferior the area of erythema is a little less and there is not as much redness, punch biopsy site with scab     Assessment:     Erythema right breast, history right breast cancer     Plan:     We discussed several options including observation, repeat MRI or pursuing her mammogram at this point, or surgery. After a long conversation we decided to just follow this right now as it appears it is getting better and she has an ultrasound as well as a punch biopsy at this site that is negative. I do not think that this is a recurrence of her cancer at this point in time but cannot tell her this with 841% certainty and will end up following her up in one month. If this gets worse or she has any other changes she could call me sooner.

## 2013-09-04 ENCOUNTER — Encounter: Payer: Self-pay | Admitting: Gynecology

## 2013-10-01 ENCOUNTER — Encounter (INDEPENDENT_AMBULATORY_CARE_PROVIDER_SITE_OTHER): Payer: Self-pay | Admitting: General Surgery

## 2013-10-01 ENCOUNTER — Ambulatory Visit (INDEPENDENT_AMBULATORY_CARE_PROVIDER_SITE_OTHER): Payer: BC Managed Care – PPO | Admitting: General Surgery

## 2013-10-01 VITALS — BP 122/76 | HR 80 | Temp 97.8°F | Ht 66.0 in | Wt 134.0 lb

## 2013-10-01 DIAGNOSIS — C50411 Malignant neoplasm of upper-outer quadrant of right female breast: Secondary | ICD-10-CM

## 2013-10-01 DIAGNOSIS — C50419 Malignant neoplasm of upper-outer quadrant of unspecified female breast: Secondary | ICD-10-CM

## 2013-10-01 NOTE — Progress Notes (Signed)
Subjective:     Patient ID: Brooke Wilkinson, female   DOB: 04/14/1951, 63 y.o.   MRN: [id number]  HPI 29 yof who underwent in 2010 a right breast lumpectomy/sn followed by mammosite. This was after abnormal mammogram. She is hr positive but decided not to do antiestogens. Since then she has had some swelling laterally with some discomfort. She does state there was a burn from her mammosite after surgery. She has some scarring issues and the lumpectomy scar has begun turning in. I have done [place] as documented previously. I then did a punch biopsy with result being fibrosis and inflammation. No malignancy. She comes back today and she thinks this area is better and smaller again.  She is not concerned today.   Review of Systems     Objective:   Physical Exam Healed incision, this area has some erythema and there is still scab where I did punch but I do think this is improving    Assessment:     Erythema right breast, history right breast cancer    Plan:     We discussed several options including observation, repeat MRI or pursuing her mammogram at this point, or surgery. After a long conversation we again decided to just follow this right now as it appears it is getting better and she has an ultrasound as well as a punch biopsy at this site that is negative. I do not think that this is a recurrence of her cancer at this point in time but cannot tell her this with 048% certainty and will end up following her up in a couple of months. I will also have her see physical therapy in the interim. If this gets worse or she has any other changes she could call me sooner.

## 2013-10-07 ENCOUNTER — Ambulatory Visit: Payer: BC Managed Care – PPO | Attending: General Surgery | Admitting: Physical Therapy

## 2013-10-07 DIAGNOSIS — I890 Lymphedema, not elsewhere classified: Secondary | ICD-10-CM | POA: Insufficient documentation

## 2013-10-07 DIAGNOSIS — IMO0001 Reserved for inherently not codable concepts without codable children: Secondary | ICD-10-CM | POA: Diagnosis not present

## 2013-10-08 ENCOUNTER — Ambulatory Visit: Payer: BC Managed Care – PPO | Admitting: Physical Therapy

## 2013-10-08 DIAGNOSIS — IMO0001 Reserved for inherently not codable concepts without codable children: Secondary | ICD-10-CM | POA: Diagnosis not present

## 2013-10-15 ENCOUNTER — Ambulatory Visit: Payer: BC Managed Care – PPO | Admitting: Physical Therapy

## 2013-10-15 DIAGNOSIS — IMO0001 Reserved for inherently not codable concepts without codable children: Secondary | ICD-10-CM | POA: Diagnosis not present

## 2013-10-16 ENCOUNTER — Ambulatory Visit: Payer: BC Managed Care – PPO | Attending: General Surgery

## 2013-10-16 DIAGNOSIS — I890 Lymphedema, not elsewhere classified: Secondary | ICD-10-CM | POA: Insufficient documentation

## 2013-10-16 DIAGNOSIS — IMO0001 Reserved for inherently not codable concepts without codable children: Secondary | ICD-10-CM | POA: Insufficient documentation

## 2013-10-21 ENCOUNTER — Encounter: Payer: BC Managed Care – PPO | Admitting: Physical Therapy

## 2013-10-28 ENCOUNTER — Ambulatory Visit: Payer: BC Managed Care – PPO | Admitting: Physical Therapy

## 2013-10-28 DIAGNOSIS — IMO0001 Reserved for inherently not codable concepts without codable children: Secondary | ICD-10-CM | POA: Diagnosis not present

## 2013-10-30 ENCOUNTER — Ambulatory Visit: Payer: BC Managed Care – PPO | Admitting: Physical Therapy

## 2013-10-30 DIAGNOSIS — IMO0001 Reserved for inherently not codable concepts without codable children: Secondary | ICD-10-CM | POA: Diagnosis not present

## 2013-11-05 ENCOUNTER — Ambulatory Visit: Payer: BC Managed Care – PPO | Admitting: Physical Therapy

## 2013-11-05 DIAGNOSIS — IMO0001 Reserved for inherently not codable concepts without codable children: Secondary | ICD-10-CM | POA: Diagnosis not present

## 2013-11-07 ENCOUNTER — Ambulatory Visit: Payer: BC Managed Care – PPO

## 2013-11-19 ENCOUNTER — Telehealth (INDEPENDENT_AMBULATORY_CARE_PROVIDER_SITE_OTHER): Payer: Self-pay

## 2013-11-19 NOTE — Telephone Encounter (Signed)
Called pt to see about moving her appt time around on the office of 8/27 due to Dr Donne Hazel having a surgery case in the middle of clinic. The pt was telling me how much better she is now since she saw [place] last and after attending PT. The pt said she has turned a corner in the last 2 weeks with the breast healing but not completely healed. The pt just feels so much better after going 3 times now to PT b/c she has learned so much and how to help her self at home. The pt really said she feels that she doesn't need to be seen unless if Dr Donne Hazel needs to see her she will do whatever we suggest. The pt really wants to thank Dr Donne Hazel so much with referring her to PT b/c she could feel an improvement with herself after the first visit of seeing PT. The pt is so appreciative to Dr Donne Hazel listening to hear and referring her to PT. Pt said to call her back to let her know if she needs to keep the appt. Please advise.

## 2013-11-19 NOTE — Telephone Encounter (Signed)
Regular follow up is fine

## 2013-11-20 NOTE — Telephone Encounter (Signed)
Called pt to notify her that Dr Donne Hazel said regular f/u would be fine with him which will be next year 2016 after her mgm. I asked for the pt to call us once she gets her mgm scheduled so then we can make an appt with Dr Donne Hazel for a breast exam in the office. Pt understands and agrees with the plan. I will cancel the appt for 12/12/13/

## 2013-12-02 ENCOUNTER — Encounter (INDEPENDENT_AMBULATORY_CARE_PROVIDER_SITE_OTHER): Payer: Self-pay | Admitting: General Surgery

## 2013-12-02 NOTE — Telephone Encounter (Signed)
Appt made with Dr Dalbert Batman for urgent office on 8/18 arrive at 2:30/2:45 to have the blood blister evaluated.

## 2013-12-03 ENCOUNTER — Encounter (INDEPENDENT_AMBULATORY_CARE_PROVIDER_SITE_OTHER): Payer: Self-pay | Admitting: General Surgery

## 2013-12-03 ENCOUNTER — Ambulatory Visit (INDEPENDENT_AMBULATORY_CARE_PROVIDER_SITE_OTHER): Payer: BC Managed Care – PPO | Admitting: General Surgery

## 2013-12-03 VITALS — BP 136/80 | HR 77 | Temp 97.9°F | Ht 66.0 in | Wt 132.0 lb

## 2013-12-03 DIAGNOSIS — Z5189 Encounter for other specified aftercare: Secondary | ICD-10-CM

## 2013-12-03 DIAGNOSIS — S21009A Unspecified open wound of unspecified breast, initial encounter: Secondary | ICD-10-CM

## 2013-12-03 DIAGNOSIS — S21001D Unspecified open wound of right breast, subsequent encounter: Secondary | ICD-10-CM

## 2013-12-03 NOTE — Patient Instructions (Signed)
The wound in your right breast is a slow to heal. We have essentially proven that this is not cancer. I think you're having difficulty healing this wound because of the prior breast surgery and accelerated partial breast irradiation.  For now, I do not recommend any invasive intervention.  For now, cleanse this area gently with dial soap daily.  For now, cover this area was dry gauze if it is  draining.  Return to see Dr. Donne Hazel in one month.  If this does not heal after several months,  then one alternative would be referral to a plastic surgeon for consideration of  excision and closure. I would not do that right now.

## 2013-12-03 NOTE — Progress Notes (Signed)
Patient ID: Brooke Wilkinson, female   DOB: Aug 14, 1950, 63 y.o.   MRN: [id number]  History: This patient presented to the urgent office because of a nonhealing wound of her right breast upper outer quadrant. Originally she was Dr. Dickie La patient and underwent right breast lumpectomy with MammoSite she is receptor positive but decided against AI's.  . She noted a color change in April of 2015 and Dr. Donne Hazel did a punch biopsy which was benign. She had left breast mammogram and right breast ultrasound on 08/21/2013. She was too tender for  right mammogram. She comes in today because she has 2 open areas that drain a little bit. She is still a little tender. This is becoming a chronic problem.  Exam: Pleasant patient. No distress Healed lumpectomy incision upper outer quadrant right breast. Just below the incision there is some chronic hyperpigmentation and 2 tiny open areas. After prepping  This out I passed  a hemostat between the 2 and simply opened this up. Did not seem to be any deep space fluid collection or abscess. There is no cellulitis.  Assessment: Chronic nonhealing wound right breast, adjacent to lumpectomy and MammoSite position. Suspect chronic radiation changes may be contributed to this\ recent imaging showed no evidence of cancer, but she does need a right mammogram For now, advise shower with dial soap daily, dry gauze bandage if draining Eventually need right breast mammogram to complete annual surveillance Followup with Dr. Donne Hazel in one month If this becomes a chronic wound, and this may need to be excised and closed, possibly involving plastic surgery.   Edsel Petrin. Dalbert Batman, M.D., [facility] General and Minimally invasive Surgery Breast and Colorectal Surgery Office:   [phone] Pager:   [phone]

## 2013-12-09 ENCOUNTER — Encounter: Payer: Self-pay | Admitting: Internal Medicine

## 2013-12-12 ENCOUNTER — Encounter (INDEPENDENT_AMBULATORY_CARE_PROVIDER_SITE_OTHER): Payer: BC Managed Care – PPO | Admitting: General Surgery

## 2014-01-06 ENCOUNTER — Encounter (INDEPENDENT_AMBULATORY_CARE_PROVIDER_SITE_OTHER): Payer: BC Managed Care – PPO | Admitting: General Surgery

## 2014-02-17 ENCOUNTER — Encounter (INDEPENDENT_AMBULATORY_CARE_PROVIDER_SITE_OTHER): Payer: Self-pay | Admitting: General Surgery

## 2014-05-05 ENCOUNTER — Ambulatory Visit: Payer: Commercial Indemnity | Admitting: Gynecology

## 2014-05-06 ENCOUNTER — Ambulatory Visit: Payer: BC Managed Care – PPO | Admitting: Certified Nurse Midwife

## 2014-07-08 ENCOUNTER — Ambulatory Visit (INDEPENDENT_AMBULATORY_CARE_PROVIDER_SITE_OTHER): Payer: 59 | Admitting: Certified Nurse Midwife

## 2014-07-08 ENCOUNTER — Encounter: Payer: Self-pay | Admitting: Certified Nurse Midwife

## 2014-07-08 VITALS — BP 124/74 | HR 76 | Resp 16 | Ht 65.25 in | Wt 136.0 lb

## 2014-07-08 DIAGNOSIS — Z124 Encounter for screening for malignant neoplasm of cervix: Secondary | ICD-10-CM | POA: Diagnosis not present

## 2014-07-08 DIAGNOSIS — R899 Unspecified abnormal finding in specimens from other organs, systems and tissues: Secondary | ICD-10-CM

## 2014-07-08 DIAGNOSIS — Z0000 Encounter for general adult medical examination without abnormal findings: Secondary | ICD-10-CM

## 2014-07-08 DIAGNOSIS — R350 Frequency of micturition: Secondary | ICD-10-CM

## 2014-07-08 DIAGNOSIS — B373 Candidiasis of vulva and vagina: Secondary | ICD-10-CM

## 2014-07-08 DIAGNOSIS — Z1211 Encounter for screening for malignant neoplasm of colon: Secondary | ICD-10-CM | POA: Diagnosis not present

## 2014-07-08 DIAGNOSIS — Z01419 Encounter for gynecological examination (general) (routine) without abnormal findings: Secondary | ICD-10-CM

## 2014-07-08 DIAGNOSIS — B3731 Acute candidiasis of vulva and vagina: Secondary | ICD-10-CM

## 2014-07-08 LAB — COMPREHENSIVE METABOLIC PANEL
ALK PHOS: 88 U/L (ref 39–117)
ALT: 22 U/L (ref 0–35)
AST: 23 U/L (ref 0–37)
Albumin: 5 g/dL (ref 3.5–5.2)
BUN: 14 mg/dL (ref 6–23)
CALCIUM: 10 mg/dL (ref 8.4–10.5)
CHLORIDE: 103 meq/L (ref 96–112)
CO2: 28 mEq/L (ref 19–32)
Creat: 0.68 mg/dL (ref 0.50–1.10)
GLUCOSE: 94 mg/dL (ref 70–99)
Potassium: 4.1 mEq/L (ref 3.5–5.3)
Sodium: 141 mEq/L (ref 135–145)
Total Bilirubin: 0.4 mg/dL (ref 0.2–1.2)
Total Protein: 7.8 g/dL (ref 6.0–8.3)

## 2014-07-08 LAB — CBC WITH DIFFERENTIAL/PLATELET
Basophils Absolute: 0 10*3/uL (ref 0.0–0.1)
Basophils Relative: 0 % (ref 0–1)
EOS ABS: 0.1 10*3/uL (ref 0.0–0.7)
EOS PCT: 2 % (ref 0–5)
HEMATOCRIT: 43.9 % (ref 36.0–46.0)
HEMOGLOBIN: 14.9 g/dL (ref 12.0–15.0)
Lymphocytes Relative: 51 % — ABNORMAL HIGH (ref 12–46)
Lymphs Abs: 2.8 10*3/uL (ref 0.7–4.0)
MCH: 31.1 pg (ref 26.0–34.0)
MCHC: 33.9 g/dL (ref 30.0–36.0)
MCV: 91.6 fL (ref 78.0–100.0)
MONOS PCT: 9 % (ref 3–12)
MPV: 8.9 fL (ref 8.6–12.4)
Monocytes Absolute: 0.5 10*3/uL (ref 0.1–1.0)
NEUTROS PCT: 38 % — AB (ref 43–77)
Neutro Abs: 2.1 10*3/uL (ref 1.7–7.7)
PLATELETS: 266 10*3/uL (ref 150–400)
RBC: 4.79 MIL/uL (ref 3.87–5.11)
RDW: 13.1 % (ref 11.5–15.5)
WBC: 5.5 10*3/uL (ref 4.0–10.5)

## 2014-07-08 LAB — LIPID PANEL
CHOLESTEROL: 280 mg/dL — AB (ref 0–200)
HDL: 53 mg/dL (ref >=46)
LDL CALC: 187 mg/dL — AB (ref 0–99)
TRIGLYCERIDES: 199 mg/dL — AB (ref <150)
Total CHOL/HDL Ratio: 5.3 Ratio
VLDL: 40 mg/dL (ref 0–40)

## 2014-07-08 LAB — POCT URINALYSIS DIPSTICK
Bilirubin, UA: NEGATIVE
Glucose, UA: NEGATIVE
Ketones, UA: NEGATIVE
Leukocytes, UA: NEGATIVE
NITRITE UA: NEGATIVE
PH UA: 5
Protein, UA: NEGATIVE
RBC UA: NEGATIVE
UROBILINOGEN UA: NEGATIVE

## 2014-07-08 LAB — THYROID PANEL WITH TSH
FREE THYROXINE INDEX: 1.8 (ref 1.4–3.8)
T3 UPTAKE: 27 % (ref 22–35)
T4, Total: 6.7 ug/dL (ref 4.5–12.0)
TSH: 1.575 u[IU]/mL (ref 0.350–4.500)

## 2014-07-08 MED ORDER — TERCONAZOLE 0.4 % VA CREA: 1.0000 | TOPICAL_CREAM | Freq: Every day | VAGINAL | Status: DC

## 2014-07-08 NOTE — Patient Instructions (Addendum)
EXERCISE AND DIET:  We recommended that you start or continue a regular exercise program for good health. Regular exercise means any activity that makes your heart beat faster and makes you sweat.  We recommend exercising at least 30 minutes per day at least 3 days a week, preferably 4 or 5.  We also recommend a diet low in fat and sugar.  Inactivity, poor dietary choices and obesity can cause diabetes, heart attack, stroke, and kidney damage, among others.    ALCOHOL AND SMOKING:  Women should limit their alcohol intake to no more than 7 drinks/beers/glasses of wine (combined, not each!) per week. Moderation of alcohol intake to this level decreases your risk of breast cancer and liver damage. And of course, no recreational drugs are part of a healthy lifestyle.  And absolutely no smoking or even second hand smoke. Most people know smoking can cause heart and lung diseases, but did you know it also contributes to weakening of your bones? Aging of your skin?  Yellowing of your teeth and nails?  CALCIUM AND VITAMIN D:  Adequate intake of calcium and Vitamin D are recommended.  The recommendations for exact amounts of these supplements seem to change often, but generally speaking 600 mg of calcium (either carbonate or citrate) and 800 units of Vitamin D per day seems prudent. Certain women may benefit from higher intake of Vitamin D.  If you are among these women, your doctor will have told you during your visit.    PAP SMEARS:  Pap smears, to check for cervical cancer or precancers,  have traditionally been done yearly, although recent scientific advances have shown that most women can have pap smears less often.  However, every woman still should have a physical exam from her gynecologist every year. It will include a breast check, inspection of the vulva and vagina to check for abnormal growths or skin changes, a visual exam of the cervix, and then an exam to evaluate the size and shape of the uterus and  ovaries.  And after 65 years of age, a rectal exam is indicated to check for rectal cancers. We will also provide age appropriate advice regarding health maintenance, like when you should have certain vaccines, screening for sexually transmitted diseases, bone density testing, colonoscopy, mammograms, etc.   MAMMOGRAMS:  All women over 40 years old should have a yearly mammogram. Many facilities now offer a "3D" mammogram, which may cost around $50 extra out of pocket. If possible,  we recommend you accept the option to have the 3D mammogram performed.  It both reduces the number of women who will be called back for extra views which then turn out to be normal, and it is better than the routine mammogram at detecting truly abnormal areas.    COLONOSCOPY:  Colonoscopy to screen for colon cancer is recommended for all women at age 50.  We know, you hate the idea of the prep.  We agree, BUT, having colon cancer and not knowing it is worse!!  Colon cancer so often starts as a polyp that can be seen and removed at colonscopy, which can quite literally save your life!  And if your first colonoscopy is normal and you have no family history of colon cancer, most women don't have to have it again for 10 years.  Once every ten years, you can do something that may end up saving your life, right?  We will be happy to help you get it scheduled when you are ready.    Be sure to check your insurance coverage so you understand how much it will cost.  It may be covered as a preventative service at no cost, but you should check your particular policy.     Urinary Frequency The number of times a normal person urinates depends upon how much liquid they take in and how much liquid they are losing. If the temperature is hot and there is high humidity, then the person will sweat more and usually breathe a little more frequently. These factors decrease the amount of frequency of urination that would be considered normal. The amount  you drink is easily determined, but the amount of fluid lost is sometimes more difficult to calculate.  Fluid is lost in two ways:  Sensible fluid loss is usually measured by the amount of urine that you get rid of. Losses of fluid can also occur with diarrhea.  Insensible fluid loss is more difficult to measure. It is caused by evaporation. Insensible loss of fluid occurs through breathing and sweating. It usually ranges from a little less than a quart to a little more than a quart of fluid a day. In normal temperatures and activity levels, the average person may urinate 4 to 7 times in a 24-hour period. Needing to urinate more often than that could indicate a problem. If one urinates 4 to 7 times in 24 hours and has large volumes each time, that could indicate a different problem from one who urinates 4 to 7 times a day and has small volumes. The time of urinating is also important. Most urinating should be done during the waking hours. Getting up at night to urinate frequently can indicate some problems. CAUSES  The bladder is the organ in your lower abdomen that holds urine. Like a balloon, it swells some as it fills up. Your nerves sense this and tell you it is time to head for the bathroom. There are a number of reasons that you might feel the need to urinate more often than usual. They include:  Urinary tract infection. This is usually associated with other signs such as burning when you urinate.  In men, problems with the prostate (a walnut-size gland that is located near the tube that carries urine out of your body). There are two reasons why the prostate can cause an increased frequency of urination:  An enlarged prostate that does not let the bladder empty well. If the bladder only half empties when you urinate, then it only has half the capacity to fill before you have to urinate again.  The nerves in the bladder become more hypersensitive with an increased size of the prostate even if  the bladder empties completely.  Pregnancy.  Obesity. Excess weight is more likely to cause a problem for women than for men.  Bladder stones or other bladder problems.  Caffeine.  Alcohol.  Medications. For example, drugs that help the body get rid of extra fluid (diuretics) increase urine production. Some other medicines must be taken with lots of fluids.  Muscle or nerve weakness. This might be the result of a spinal cord injury, a stroke, multiple sclerosis, or Parkinson disease.  Long-standing diabetes can decrease the sensation of the bladder. This loss of sensation makes it harder to sense the bladder needs to be emptied. Over a period of years, the bladder is stretched out by constant overfilling. This weakens the bladder muscles so that the bladder does not empty well and has less capacity to fill with new urine.  Interstitial cystitis (also called painful bladder syndrome). This condition develops because the tissues that line the inside of the bladder are inflamed (inflammation is the body's way of reacting to injury or infection). It causes pain and frequent urination. It occurs in women more often than in men. DIAGNOSIS   To decide what might be causing your urinary frequency, your health care provider will probably:  Ask about symptoms you have noticed.  Ask about your overall health. This will include questions about any medications you are taking.  Do a physical examination.  Order some tests. These might include:  A blood test to check for diabetes or other health issues that could be contributing to the problem.  Urine testing. This could measure the flow of urine and the pressure on the bladder.  A test of your neurological system (the brain, spinal cord, and nerves). This is the system that senses the need to urinate.  A bladder test to check whether it is emptying completely when you urinate.  Cystoscopy. This test uses a thin tube with a tiny camera on it.  It offers a look inside your urethra and bladder to see if there are problems.  Imaging tests. You might be given a contrast dye and then asked to urinate. X-rays are taken to see how your bladder is working. TREATMENT  It is important for you to be evaluated to determine if the amount or frequency that you have is unusual or abnormal. If it is found to be abnormal, the cause should be determined and this can usually be found out easily. Depending upon the cause, treatment could include medication, stimulation of the nerves, or surgery. There are not too many things that you can do as an individual to change your urinary frequency. It is important that you balance the amount of fluid intake needed to compensate for your activity and the temperature. Medical problems will be diagnosed and taken care of by your physician. There is no particular bladder training such as Kegel exercises that you can do to help urinary frequency. This is an exercise that is usually recommended for people who have leaking of urine when they laugh, cough, or sneeze. HOME CARE INSTRUCTIONS   Take any medications your health care provider prescribed or suggested. Follow the directions carefully.  Practice any lifestyle changes that are recommended. These might include:  Drinking less fluid or drinking at different times of the day. If you need to urinate often during the night, for example, you may need to stop drinking fluids early in the evening.  Cutting down on caffeine or alcohol. They both can make you need to urinate more often than normal. Caffeine is found in coffee, tea, and sodas.  Losing weight, if that is recommended.  Keep a journal or a log. You might be asked to record how much you drink and when and where you feel the need to urinate. This will also help evaluate how well the treatment provided by your physician is working. SEEK MEDICAL CARE IF:   Your need to urinate often gets worse.  You feel  increased pain or irritation when you urinate.  You notice blood in your urine.  You have questions about any medications that your health care provider recommended.  You notice blood, pus, or swelling at the site of any test or treatment procedure.  You develop a fever of more than 100.31F (38.1C). SEEK IMMEDIATE MEDICAL CARE IF:  You develop a fever of more than 102.1F (38.9C).  Document Released: 01/29/2009 Document Revised: 08/19/2013 Document Reviewed: 01/29/2009 [facility] Patient Information 2015 [place], [place]. This information is not intended to replace advice given to you by your health care provider. Make sure you discuss any questions you have with your health care provider.

## 2014-07-08 NOTE — Progress Notes (Addendum)
64 y.o. G48P1001 Married  Caucasian Fe here for annual exam. Menopausal no HRT. Denies vaginal bleeding or vaginal dryness. Patient having some urgency to urinate in the past few months. Feels this may be from vaginal dryness. Patient does not follow up with oncology for breast cancer. She also will not have a mammogram of the right breast due to pain and lymphedema. Patient sees PCP prn and alternative MD for medical management. Complaining of bump on right vulva, please check. Also has noticed that she has white discharge at times and slight itching for ? 1 week. No other health issues.  No LMP recorded. Patient is postmenopausal.          Sexually active: Yes.    The current method of family planning is post menopausal status.    Exercising: No.  exercise Smoker:  no  Health Maintenance: Pap:  04-30-12 neg HPV HR neg MMG:  08-21-13 u/s rt breast, pt states she had f/u done Colonoscopy:  10/10 previous colon polyp 5 years BMD:   2/15 osteopenia TDaP: unsure   Labs: Poct urine-neg Self breast exam: done monthly   reports that she has never smoked. She has never used smokeless tobacco. She reports that she drinks alcohol. She reports that she does not use illicit drugs.  Past Medical History  Diagnosis Date  . Osteopenia   . MVP (mitral valve prolapse)     No antibiotics required for procedures  . Breast cancer     Right breast-Invasive ductal  . Adenomatous colon polyp   . Dyslipidemia   . Diverticulosis   . Thyroid disease   . GERD (gastroesophageal reflux disease)   . Fatty liver 05/05/2011    Ultrasound   . Hx of radiation therapy 12/02/08- 12/08/08    right breast mammosite, 34 Gy 10 fractions, given twice daily  . Ductal carcinoma of breast, stage 1 2010  . Hiatal hernia     Past Surgical History  Procedure Laterality Date  . Pelvic laparoscopy    . Breast surgery  2010    Lumpectomy; Right- Mammocyte    Current Outpatient Prescriptions  Medication Sig Dispense Refill   . Ascorbic Acid (VITAMIN C PO) Take by mouth.      . Cholecalciferol (VITAMIN D PO) Take 5,000 Units by mouth 2 (two) times daily.      Marland Kitchen KRILL OIL PO Take by mouth.      . liothyronine (CYTOMEL) 5 MCG tablet Take 7.5 mcg by mouth daily.     . Multiple Vitamin (MULTIVITAMIN) tablet Take 1 tablet by mouth daily.      Marland Kitchen NALTREXONE HCL PO Take 4.5 mg by mouth daily.     . Probiotic Product (PROBIOTIC FORMULA PO) Take by mouth.      Marland Kitchen UNABLE TO FIND 12.5 mg. Med Name:Iodoral     No current facility-administered medications for this visit.    Family History  Problem Relation Age of Onset  . Diabetes Mother     Type 1  . Hypertension Mother   . Heart disease Mother   . Osteoporosis Sister   . Breast cancer Maternal Grandmother   . Colon cancer Neg Hx   . Alcohol abuse Father     ROS:  Pertinent items are noted in HPI.  Otherwise, a comprehensive ROS was negative.  Exam:   BP 124/74 mmHg  Pulse 76  Resp 16  Ht 5' 5.25" (1.657 m)  Wt 136 lb (61.689 kg)  BMI 22.47 kg/m2 Height: 5'  5.25" (165.7 cm) Ht Readings from Last 3 Encounters:  07/08/14 5' 5.25" (1.657 m)  12/03/13 5\' 6"  (1.676 m)  10/01/13 5\' 6"  (1.676 m)    General appearance: alert, cooperative and appears stated age Head: Normocephalic, without obvious abnormality, atraumatic Neck: no adenopathy, supple, symmetrical, trachea midline and thyroid normal to inspection and palpation Lungs: clear to auscultation bilaterally Breasts: normal appearance, no masses or tenderness, No nipple retraction or dimpling, No nipple discharge or bleeding, No axillary or supraclavicular adenopathy, lumpectomy changes noted on right breast only Heart: regular rate and rhythm Abdomen: soft, non-tender; no masses,  no organomegaly Extremities: extremities normal, atraumatic, no cyanosis or edema Skin: Skin color, texture, turgor normal. No rashes or lesions Lymph nodes: Cervical, supraclavicular, and axillary nodes normal. No abnormal  inguinal nodes palpated Neurologic: Grossly normal   Pelvic: External genitalia:  no lesions              Urethra:  normal appearing urethra with no masses, tenderness, small sebaceous cyst on right distal end of vulva, non tender, no redness, shown to patient              Bartholin's and Skene's: normal                 Vagina:dry  appearing vagina with normal color and  Thick white discharge, no lesions wet prep taken ph 4.0              Cervix: normal, non tender,no lesions              Pap taken: Yes.   Bimanual Exam:  Uterus:  normal size, contour, position, consistency, mobility, non-tender              Adnexa: normal adnexa and no mass, fullness, tenderness               Rectovaginal: Confirms               Anus:  normal sphincter tone, no lesions  Chaperone present: Yes  A:  Well Woman with normal exam  Menopausal no HRT  History of Right breast cancer will not do mammograms on affected side  Yeast vaginitis  Vaginal dryness  Sebaceous cyst  Urinary frequency R/O UTI  Screening labs  P:   Reviewed health and wellness pertinent to exam  Aware of need to evaluate if vaginal bleeding  Discussed importance of follow up for prevention of reoccurrence, she is aware and chooses not to have done  Reviewed findings of yeast vaginitis and need for treatment. Discussed vaginal dryness can contribute.  Discussed coconut oil use for vaginal dryness, patient will try.  Reviewed findings and no treatment needed. Warnings signs given.  Lab Urine culture,  Pap smear taken today with HPV reflex  Labs: Vitamin D, Thyroid Panel with TSH  Lipid Panel, CMP, CBC diff.  Discussed need for colonoscopy follow up will schedule. IFOB dispensed.   counseled on breast self exam, mammography screening, adequate intake of calcium and vitamin D, diet and exercise  return annually or prn  An After Visit Summary was printed and given to the patient.

## 2014-07-09 LAB — VITAMIN D 25 HYDROXY (VIT D DEFICIENCY, FRACTURES): Vit D, 25-Hydroxy: 73 ng/mL (ref 30–100)

## 2014-07-09 LAB — URINE CULTURE
COLONY COUNT: NO GROWTH
ORGANISM ID, BACTERIA: NO GROWTH

## 2014-07-10 LAB — IPS PAP TEST WITH REFLEX TO HPV

## 2014-07-16 NOTE — Progress Notes (Signed)
Reviewed personally.  M. Suzanne Tomasita Beevers, MD.  

## 2014-07-18 ENCOUNTER — Telehealth: Payer: Self-pay

## 2014-07-18 NOTE — Telephone Encounter (Signed)
Spoke with patient. Advised of results as seen below from [place]. Patient is agreeable. "I take niacin for for cholesterol but I have not been taking it lately. I need to start taking it again." Patient declines to schedule follow up lab appointment at this time. Would like to return call next week to schedule this appointment.  Routing to provider for final review. Patient agreeable to disposition. Will close encounter

## 2014-07-18 NOTE — Addendum Note (Signed)
Addended by: Regina Eck on: 07/18/2014 11:58 AM   Modules accepted: Orders, SmartSet

## 2014-07-18 NOTE — Addendum Note (Signed)
Addended by: Regina Eck on: 07/18/2014 11:29 AM   Modules accepted: Miquel Dunn

## 2014-07-18 NOTE — Telephone Encounter (Signed)
Left message to call [place] at [phone].  Notes Recorded by Regina Eck, CNM on 07/10/2014 at 2:51 PM Notify patient that cholesterol is very high at 280 normal less than 200 and triglycerides are elevated at 199 normal 150 or less and LDL is elevated 187 normal less than 99 she has PCP and needs to follow up with him regarding her labs. She will need copy to go PCP Liver, kidney and glucose profile normal TSH and thyroid panel normal Vitamin D normal at 73 CBC neutrophils slightly low and lymphocytes relative slightly elevated all else normal. WBC normal, needs recheck in 1 week order in Urine culture negative Pap smear negative 02

## 2014-07-20 ENCOUNTER — Encounter: Payer: Self-pay | Admitting: Certified Nurse Midwife

## 2014-07-23 ENCOUNTER — Other Ambulatory Visit (INDEPENDENT_AMBULATORY_CARE_PROVIDER_SITE_OTHER): Payer: 59

## 2014-07-23 DIAGNOSIS — R899 Unspecified abnormal finding in specimens from other organs, systems and tissues: Secondary | ICD-10-CM

## 2014-07-23 LAB — CBC WITH DIFFERENTIAL/PLATELET
Basophils Absolute: 0 10*3/uL (ref 0.0–0.1)
Basophils Relative: 0 % (ref 0–1)
EOS ABS: 0.1 10*3/uL (ref 0.0–0.7)
EOS PCT: 2 % (ref 0–5)
HEMATOCRIT: 41.6 % (ref 36.0–46.0)
Hemoglobin: 14.3 g/dL (ref 12.0–15.0)
LYMPHS ABS: 3 10*3/uL (ref 0.7–4.0)
Lymphocytes Relative: 50 % — ABNORMAL HIGH (ref 12–46)
MCH: 31.2 pg (ref 26.0–34.0)
MCHC: 34.4 g/dL (ref 30.0–36.0)
MCV: 90.8 fL (ref 78.0–100.0)
MPV: 9.2 fL (ref 8.6–12.4)
Monocytes Absolute: 0.5 10*3/uL (ref 0.1–1.0)
Monocytes Relative: 9 % (ref 3–12)
Neutro Abs: 2.3 10*3/uL (ref 1.7–7.7)
Neutrophils Relative %: 39 % — ABNORMAL LOW (ref 43–77)
Platelets: 254 10*3/uL (ref 150–400)
RBC: 4.58 MIL/uL (ref 3.87–5.11)
RDW: 12.9 % (ref 11.5–15.5)
WBC: 5.9 10*3/uL (ref 4.0–10.5)

## 2014-07-27 ENCOUNTER — Other Ambulatory Visit: Payer: Self-pay | Admitting: Certified Nurse Midwife

## 2014-07-27 DIAGNOSIS — R899 Unspecified abnormal finding in specimens from other organs, systems and tissues: Secondary | ICD-10-CM

## 2014-07-29 ENCOUNTER — Telehealth: Payer: Self-pay

## 2014-07-29 NOTE — Telephone Encounter (Signed)
-----   Message from Regina Eck, CNM sent at 07/27/2014  5:32 PM EDT ----- Notify patient that her Neutrophils and lymphocytes on her CBC differential have not changed significantly, consulted with hematology and Dr. Sabra Heck and recommend just to repeat in 6 months, no concerns at this point. Order in please schedule

## 2014-07-29 NOTE — Telephone Encounter (Signed)
Patient notified of results. See lab 

## 2014-07-29 NOTE — Telephone Encounter (Signed)
lmtcb

## 2014-08-03 ENCOUNTER — Encounter: Payer: Self-pay | Admitting: Certified Nurse Midwife

## 2014-08-04 LAB — FECAL OCCULT BLOOD, IMMUNOCHEMICAL: IMMUNOLOGICAL FECAL OCCULT BLOOD TEST: NEGATIVE

## 2015-01-13 ENCOUNTER — Other Ambulatory Visit: Payer: Self-pay | Admitting: Family Medicine

## 2015-01-13 DIAGNOSIS — E049 Nontoxic goiter, unspecified: Secondary | ICD-10-CM

## 2015-01-20 ENCOUNTER — Ambulatory Visit
Admission: RE | Admit: 2015-01-20 | Discharge: 2015-01-20 | Disposition: A | Discharge status: Home / Self Care | Payer: PPO | Source: Ambulatory Visit | Attending: Family Medicine | Admitting: Family Medicine

## 2015-01-20 DIAGNOSIS — E049 Nontoxic goiter, unspecified: Secondary | ICD-10-CM

## 2015-01-28 ENCOUNTER — Telehealth: Payer: Self-pay | Admitting: Certified Nurse Midwife

## 2015-01-28 ENCOUNTER — Other Ambulatory Visit: Payer: 59

## 2015-01-28 NOTE — Telephone Encounter (Signed)
Patient canceled her appointment for blood work today. She is having her blood work drawn by [employer] [facility] and will bring in her results.

## 2015-03-09 ENCOUNTER — Encounter: Payer: Self-pay | Admitting: Cardiology

## 2015-04-09 ENCOUNTER — Other Ambulatory Visit: Payer: Self-pay | Admitting: Family Medicine

## 2015-04-09 DIAGNOSIS — K760 Fatty (change of) liver, not elsewhere classified: Secondary | ICD-10-CM

## 2015-04-17 ENCOUNTER — Ambulatory Visit
Admission: RE | Admit: 2015-04-17 | Discharge: 2015-04-17 | Disposition: A | Discharge status: Home / Self Care | Payer: PPO | Source: Ambulatory Visit | Attending: Family Medicine | Admitting: Family Medicine

## 2015-04-17 DIAGNOSIS — K760 Fatty (change of) liver, not elsewhere classified: Secondary | ICD-10-CM

## 2015-07-21 ENCOUNTER — Encounter: Payer: Self-pay | Admitting: Certified Nurse Midwife

## 2015-07-21 ENCOUNTER — Ambulatory Visit (INDEPENDENT_AMBULATORY_CARE_PROVIDER_SITE_OTHER): Payer: PPO | Admitting: Certified Nurse Midwife

## 2015-07-21 VITALS — BP 120/76 | HR 70 | Resp 16 | Ht 65.25 in | Wt 142.0 lb

## 2015-07-21 DIAGNOSIS — Z01419 Encounter for gynecological examination (general) (routine) without abnormal findings: Secondary | ICD-10-CM

## 2015-07-21 DIAGNOSIS — Z124 Encounter for screening for malignant neoplasm of cervix: Secondary | ICD-10-CM

## 2015-07-21 DIAGNOSIS — Z0000 Encounter for general adult medical examination without abnormal findings: Secondary | ICD-10-CM

## 2015-07-21 DIAGNOSIS — Z23 Encounter for immunization: Secondary | ICD-10-CM | POA: Diagnosis not present

## 2015-07-21 LAB — POCT URINALYSIS DIPSTICK
BILIRUBIN UA: NEGATIVE
Glucose, UA: NEGATIVE
KETONES UA: NEGATIVE
Leukocytes, UA: NEGATIVE
NITRITE UA: NEGATIVE
PH UA: 5
PROTEIN UA: NEGATIVE
RBC UA: NEGATIVE
Urobilinogen, UA: NEGATIVE

## 2015-07-21 NOTE — Progress Notes (Signed)
65 y.o. G31P1001 Married  Caucasian Fe here for annual exam.  Menopausal no HRT. Denies vaginal bleeding or vaginal dryness. Patient drinks water frequently throughout the day. Stress incontinence with sneezing only. Denies any urinary symptoms of frequency or urgency or pain. Sees urgent care if needed. Had PCP and was unhappy with her. Previous history of fatty liver and is managed by Dr. Hilary Hertz and following with him with right breast scarring. Also does all labs and aex.  No further follow up with oncology since breast cancer in right breast. Patient will not have any further mammograms due to the problems with the bunch biopsy done. Understands the risk with previous breast cancer, but will do scans only. No other health concerns today.   No LMP recorded. Patient is postmenopausal.          Sexually active: Yes.    The current method of family planning is post menopausal status.    Exercising: No.  exercise Smoker:  no  Health Maintenance: Pap:  07-08-14 neg MMG:  08-21-13 u/s rt breast, punch biopsy pt states she had f/u done,pt states she had u/s done last yr both breast & it was neg. Patient will not have another mammogram due to the biopsy and all she has done. She will do [place] scan and had one day last year, all negative. She will do again this year, but no mammogram!!  Colonoscopy: 10/10 previous polyp f/u 20yrs  Patient to have this done year. BMD:   2/15 osteopenia TDaP: greater than 10 years Shingles: no Pneumonia: no Hep C and HIV: not done Labs: poct urine-neg Self breast exam: done occ   reports that she has never smoked. She has never used smokeless tobacco. She reports that she does not drink alcohol or use illicit drugs.  Past Medical History  Diagnosis Date  . Osteopenia   . MVP (mitral valve prolapse)     No antibiotics required for procedures  . Breast cancer ([place])     Right breast-Invasive ductal  . Adenomatous colon polyp   . Dyslipidemia   . Diverticulosis   .  Thyroid disease   . GERD (gastroesophageal reflux disease)   . Fatty liver 05/05/2011    Ultrasound   . Hx of radiation therapy 12/02/08- 12/08/08    right breast mammosite, 34 Gy 10 fractions, given twice daily  . Ductal carcinoma of breast, stage 1 ([place]) 2010  . Hiatal hernia     Past Surgical History  Procedure Laterality Date  . Pelvic laparoscopy    . Breast surgery  2010    Lumpectomy; Right- Mammocyte    Current Outpatient Prescriptions  Medication Sig Dispense Refill  . Ascorbic Acid (VITAMIN C PO) Take by mouth.      . Cholecalciferol (VITAMIN D PO) Take 5,000 Units by mouth 2 (two) times daily.      Marland Kitchen KRILL OIL PO Take by mouth.      . liothyronine (CYTOMEL) 5 MCG tablet Take 5 mcg by mouth daily.     . Multiple Vitamin (MULTIVITAMIN) tablet Take 1 tablet by mouth daily.      Marland Kitchen NALTREXONE HCL PO Take 4.5 mg by mouth daily.     . Probiotic Product (PROBIOTIC FORMULA PO) Take by mouth.      Marland Kitchen UNABLE TO FIND 12.5 mg. Med Name:Iodoral    . UNABLE TO FIND at bedtime. Liver support     No current facility-administered medications for this visit.    Family History  Problem Relation Age of Onset  . Diabetes Mother     Type 1  . Hypertension Mother   . Heart disease Mother   . Osteoporosis Sister   . Breast cancer Maternal Grandmother   . Colon cancer Neg Hx   . Alcohol abuse Father     ROS:  Pertinent items are noted in HPI.  Otherwise, a comprehensive ROS was negative.  Exam:   BP 120/76 mmHg  Pulse 70  Resp 16  Ht 5' 5.25" (1.657 m)  Wt 142 lb (64.411 kg)  BMI 23.46 kg/m2 Height: 5' 5.25" (165.7 cm) Ht Readings from Last 3 Encounters:  07/21/15 5' 5.25" (1.657 m)  07/08/14 5' 5.25" (1.657 m)  12/03/13 5\' 6"  (1.676 m)    General appearance: alert, cooperative and appears stated age Head: Normocephalic, without obvious abnormality, atraumatic Neck: no adenopathy, supple, symmetrical, trachea midline and thyroid normal to inspection and palpation Lungs:  clear to auscultation bilaterally Breasts: normal appearance, no masses or tenderness, No nipple retraction or dimpling, No nipple discharge or bleeding, No axillary or supraclavicular adenopathy  Right breast scarring from radiation and surgery noted Heart: regular rate and rhythm Abdomen: soft, non-tender; no masses,  no organomegaly Extremities: extremities normal, atraumatic, no cyanosis or edema Skin: Skin color, texture, turgor normal. No rashes or lesions Lymph nodes: Cervical, supraclavicular, and axillary nodes normal. No abnormal inguinal nodes palpated Neurologic: Grossly normal   Pelvic: External genitalia:  no lesions              Urethra:  normal appearing urethra with no masses, tenderness or lesions              Bartholin's and Skene's: normal                 Vagina: normal appearing vagina with normal color and discharge, no lesions, no cystocele noted              Cervix: no cervical motion tenderness, no lesions and normal appearance              Pap taken: Yes.   Bimanual Exam:  Uterus:  normal size, contour, position, consistency, mobility, non-tender              Adnexa: normal adnexa and no mass, fullness, tenderness               Rectovaginal: Confirms               Anus:  normal sphincter tone, no lesions  Chaperone present: yes  A:  Well Woman with normal exam  Menopausal no HRT  History of right breast cancer with radiation use, out of oncology follow up  Sees [facility] for aex and labs  Immunization due  Colonoscopy due aware and will schedule in May 2017  Stress incontinence with sneezing only  P:   Reviewed health and wellness pertinent to exam  Aware of need to evaluate if vaginal bleeding  Encouraged to reconsider mammogram yearly and requested patient to have copy of [place] of breasts sent to [place].  Continue follow up with MD as recommended  Risks and benefits of TDAP reviewed requests today  Discussed normal vaginal findings and to work on  [employer], will advise if change  Pap smear as above     counseled on breast self exam, mammography screening, adequate intake of calcium and vitamin D, diet and exercise, Kegel's exercises return annually or prn  An After Visit Summary was printed and  given to the patient.

## 2015-07-21 NOTE — Patient Instructions (Signed)

## 2015-07-22 NOTE — Progress Notes (Signed)
Encounter reviewed Serenna Deroy, MD   

## 2015-07-23 LAB — IPS PAP SMEAR ONLY

## 2015-09-07 DIAGNOSIS — E039 Hypothyroidism, unspecified: Secondary | ICD-10-CM | POA: Diagnosis not present

## 2015-09-07 DIAGNOSIS — N951 Menopausal and female climacteric states: Secondary | ICD-10-CM | POA: Diagnosis not present

## 2015-09-07 DIAGNOSIS — R5383 Other fatigue: Secondary | ICD-10-CM | POA: Diagnosis not present

## 2015-09-07 DIAGNOSIS — K760 Fatty (change of) liver, not elsewhere classified: Secondary | ICD-10-CM | POA: Diagnosis not present

## 2015-09-10 DIAGNOSIS — N951 Menopausal and female climacteric states: Secondary | ICD-10-CM | POA: Diagnosis not present

## 2015-12-08 ENCOUNTER — Telehealth: Payer: Self-pay | Admitting: *Deleted

## 2015-12-08 ENCOUNTER — Encounter: Payer: Self-pay | Admitting: Certified Nurse Midwife

## 2015-12-08 NOTE — Telephone Encounter (Signed)
My Chart message from patient:  I don't think this is urgent (but it may be). Over the past few weeks I have been having buildup of fluid in my abdomen. It was coming and going and I thought it was just from my diet. I know it's fluid because when I push on one side it moves to the other side. This past weekend my bowel movements changed from normal color and volume to loose and greenish. That indicates to me excess bile maybe? Now the fluid is always there.Marland KitchenMarland KitchenMarland KitchenI look pregnant and feel bloated. I have discomfort under my ribs on the left side....spleen and pancreas area. With my history of breast cancer, I need to be proactive when it comes to changes in my body....this is a significant change. I need your input as I don't have a primary care physician right now and you were the last care giver that saw me. I would like your eyes and hands on this if possible. I am also going to call the office and see if I can schedule an appointment time for this week.

## 2015-12-08 NOTE — Telephone Encounter (Signed)
See phone encounter.

## 2015-12-08 NOTE — Telephone Encounter (Signed)
Call to patient in response to My Chart message. Patient states she does not have PCP due to insurance change and length of time it takes to get appointment. States bloating has been intermittent for several weeks and increased over the weekend with additional change in bowel movements. Requests Debbi's opinion on where to start. Advised can schedule office visit and see if pelvic exam is any different than annual in April and then determine next step. Appointment scheduled for 12-09-15 at 2pm with French Ana, CNM.  Routing to provider for final review. Patient agreeable to disposition. Will close encounter.

## 2015-12-09 ENCOUNTER — Ambulatory Visit (INDEPENDENT_AMBULATORY_CARE_PROVIDER_SITE_OTHER): Payer: PPO | Admitting: Certified Nurse Midwife

## 2015-12-09 VITALS — BP 138/80 | HR 90 | Resp 16 | Ht 65.25 in | Wt 146.0 lb

## 2015-12-09 DIAGNOSIS — R140 Abdominal distension (gaseous): Secondary | ICD-10-CM

## 2015-12-09 NOTE — Progress Notes (Signed)
Subjective:     Patient ID: Brooke Wilkinson, female   DOB: Sep 12, 1950, 65 y.o.   MRN: [id number]  HPI  65 yo g1p1001 married white female here complaining of bloating for the past few weeks. Not consistent, but off and on. Now has noted more discomfort with some slight LUQ pain, Patient has noted bloating in upper abdominal area  fuller on left side. Goes up and down. Now feeling more miserable. No major change in diet, some roughage with salads. No nausea or vomiting. Some loose stools in the past 24 hours but no pain. No fever. No alcohol use. Concerned about bloating and pancreatic cancer, liver issues and ovarian cancer. She has been reading about possible causes. Decided she would start here for evaluation and go forward. Declines any labs here, will see PCP or GI for. Patient  Has history of fatty liver noted on previous CT or [place]. No alcohol use or other medication change. No other health concerns today. Brought mammogram report in normal.   Review of Systems  Constitutional: Negative.   Respiratory: Negative.   Cardiovascular: Negative.   Gastrointestinal: Positive for abdominal distention. Negative for blood in stool, constipation, diarrhea, nausea and vomiting.  Endocrine: Negative.   Genitourinary: Negative.   Neurological: Negative for dizziness and headaches.  Psychiatric/Behavioral: Negative.        Objective:   Physical Exam  Constitutional: She is oriented to person, place, and time. She appears well-developed and well-nourished.  Cardiovascular: Normal heart sounds.   Pulmonary/Chest: No respiratory distress. She exhibits no tenderness.  Abdominal: Soft. Bowel sounds are normal. She exhibits no mass. There is no splenomegaly or hepatomegaly. There is no tenderness. There is no rebound, no guarding and no CVA tenderness.    Slight abdominal ?distention noted on left upper quadrant, but patient also pushing abdomen up during exam, non tender  Genitourinary: Vagina normal  and uterus normal. There is no rash, tenderness or lesion on the right labia. There is no rash, tenderness or lesion on the left labia. No vaginal discharge found.  Neurological: She is alert and oriented to person, place, and time.  Skin: Skin is warm and dry.  Psychiatric: She has a normal mood and affect. Her behavior is normal. Judgment and thought content normal.       Assessment:     Normal pelvic exam Abdominal bloating intermittent per patient, no distinct change in upper abdominal area on exam, no tenderness or rebound or point of pain. No apparent distress. Recent loose stools, no diarrhea or blood in stool.    Plan:     Discussed normal pelvic exam finding. Declines lab screening for possible CBC with diff change. Would rather have with GI. Discussed seems to be GI in origin and would recommend GI evaluation. She is a patient of [facility] and will call to make appointment to be seen.  Reviewed warning signs with abdominal pain and need to be seen in ER. Discussed with patient dietary change can also cause some changes in digestion. She will avoid heavy foods to see if this feels better to her, but will go in for evaluation with GI.  Rv prn

## 2015-12-09 NOTE — Patient Instructions (Signed)

## 2015-12-12 NOTE — Progress Notes (Signed)
Encounter reviewed Jill Jertson, MD   

## 2015-12-29 ENCOUNTER — Encounter: Payer: Self-pay | Admitting: Gastroenterology

## 2015-12-29 ENCOUNTER — Ambulatory Visit (INDEPENDENT_AMBULATORY_CARE_PROVIDER_SITE_OTHER): Payer: PPO | Admitting: Gastroenterology

## 2015-12-29 ENCOUNTER — Telehealth: Payer: Self-pay | Admitting: Gastroenterology

## 2015-12-29 ENCOUNTER — Other Ambulatory Visit (INDEPENDENT_AMBULATORY_CARE_PROVIDER_SITE_OTHER): Payer: PPO

## 2015-12-29 VITALS — BP 140/90 | HR 100 | Ht 65.25 in | Wt 145.8 lb

## 2015-12-29 DIAGNOSIS — R0789 Other chest pain: Secondary | ICD-10-CM

## 2015-12-29 DIAGNOSIS — R140 Abdominal distension (gaseous): Secondary | ICD-10-CM | POA: Diagnosis not present

## 2015-12-29 DIAGNOSIS — Z86010 Personal history of colonic polyps: Secondary | ICD-10-CM | POA: Diagnosis not present

## 2015-12-29 LAB — COMPREHENSIVE METABOLIC PANEL
ALBUMIN: 4.6 g/dL (ref 3.5–5.2)
ALK PHOS: 62 U/L (ref 39–117)
ALT: 28 U/L (ref 0–35)
AST: 24 U/L (ref 0–37)
BUN: 9 mg/dL (ref 6–23)
CALCIUM: 9.8 mg/dL (ref 8.4–10.5)
CHLORIDE: 105 meq/L (ref 96–112)
CO2: 31 mEq/L (ref 19–32)
Creatinine, Ser: 0.73 mg/dL (ref 0.40–1.20)
GFR: 84.81 mL/min (ref >60.00)
Glucose, Bld: 106 mg/dL — ABNORMAL HIGH (ref 70–99)
POTASSIUM: 4.3 meq/L (ref 3.5–5.1)
Sodium: 141 mEq/L (ref 135–145)
TOTAL PROTEIN: 7.8 g/dL (ref 6.0–8.3)
Total Bilirubin: 0.4 mg/dL (ref 0.2–1.2)

## 2015-12-29 LAB — LIPASE: LIPASE: 29 U/L (ref 11.0–59.0)

## 2015-12-29 MED ORDER — NA SULFATE-K SULFATE-MG SULF 17.5-3.13-1.6 GM/177ML PO SOLN: 1.0000 | Freq: Once | ORAL | 0 refills | Status: AC

## 2015-12-29 NOTE — Progress Notes (Signed)
Brooke Wilkinson    [id number]    [birth year]  Primary Care Physician:No PCP Per Patient  Referring Physician: No referring provider defined for this encounter.  Chief complaint:  LUQ pain radiating to L shoulder  HPI: 65 yr F with h/o breast cancer s/p lumpectomy and local radiation therapy 7 years ago with no h/o recurrence here with c/o LUQ abd pain radiating to L shoulder blade x 10-12 weeks intermittent, sometimes exertional worse after she is working in the yard but sometimes she wakes up with the pain. Denies any dyspnea on exertion or orthopnea. She was previously followed by Dr. Olevia Perches last seen in February 2013 She feels like she is bloated and has fluid in her abdomen. Abdominal ultrasound in December 2016 showed findings suggestive of fatty liver, no ascites otherwise unremarkable ultrasound. She reports gaining about 4 or 5 pounds in the past few months and her dress size has also gone up in this time period. She sees integrative medicine specialists in [place] and has been taking herbal remedies ?Milk Thistle/ Liposomal glutathione for fatty liver for several years now. She has also been seeing chiropractor and acupuncturist for the shoulder blade pain with no significant improvement Denies any nausea, vomiting, diarrhea, constipation, melena or bright red blood per rectum Last colonoscopy in 2010 with removal of 2 tubulovillous adenoma, she is past due for surveillance colonoscopy. EGD in 2013 showed Schatzki's ring at EG junction status post dilation with 50 [place] Maloney dilator and also noted to have multiple benign appearing fundic gland gastric polyps.  Outpatient Encounter Prescriptions as of 12/29/2015  Medication Sig  . Ascorbic Acid (VITAMIN C PO) Take by mouth.    Marland Kitchen b complex vitamins tablet Take 1 tablet by mouth daily.  . Cholecalciferol (VITAMIN D PO) Take 5,000 Units by mouth 2 (two) times daily.    Marland Kitchen liothyronine (CYTOMEL) 5 MCG tablet Take 5  mcg by mouth daily.   Marland Kitchen NALTREXONE HCL PO Take 4.5 mg by mouth daily.   . Omega-3 Fatty Acids (FISH OIL) 1200 MG CAPS Take 1 capsule by mouth daily.  Marland Kitchen UNABLE TO FIND 12.5 mg. Med Name:Iodoral  . UNABLE TO FIND at bedtime. Liver support  . [DISCONTINUED] KRILL OIL PO Take by mouth.    . [DISCONTINUED] Multiple Vitamin (MULTIVITAMIN) tablet Take 1 tablet by mouth daily.    . [DISCONTINUED] Probiotic Product (PROBIOTIC FORMULA PO) Take by mouth.     No facility-administered encounter medications on file as of 12/29/2015.     Allergies as of 12/29/2015 - Review Complete 12/29/2015  Allergen Reaction Noted  . Erythromycin  05/01/2013  . Penicillins  01/30/2009  . Tetracycline      Past Medical History:  Diagnosis Date  . Adenomatous colon polyp   . Breast cancer ([place])    Right breast-Invasive ductal  . Diverticulosis   . Ductal carcinoma of breast, stage 1 ([place]) 2010  . Dyslipidemia   . Fatty liver 05/05/2011   Ultrasound   . GERD (gastroesophageal reflux disease)   . Hiatal hernia   . Hx of radiation therapy 12/02/08- 12/08/08   right breast mammosite, 34 Gy 10 fractions, given twice daily  . MVP (mitral valve prolapse)    No antibiotics required for procedures  . Osteopenia   . Thyroid disease     Past Surgical History:  Procedure Laterality Date  . BREAST BIOPSY  2015  . BREAST SURGERY  2010   Lumpectomy;  Right- Mammocyte  . PELVIC LAPAROSCOPY      Family History  Problem Relation Age of Onset  . Diabetes Mother     Type 1  . Hypertension Mother   . Heart disease Mother   . Osteoporosis Sister   . Alcohol abuse Father   . Breast cancer Maternal Grandmother   . Colon cancer Neg Hx     Social History   Social History  . Marital status: Married    Spouse name: N/A  . Number of children: 2  . Years of education: N/A   Occupational History  . Glass blower/designer    Social History Main Topics  . Smoking status: Never Smoker  . Smokeless tobacco: Never Used  .  Alcohol use No  . Drug use: No  . Sexual activity: Yes    Partners: Male    Birth control/ protection: Post-menopausal   Other Topics Concern  . Not on file   Social History Narrative   Caffeine daily       Review of systems: Review of Systems  Constitutional: Negative for fever and chills.  HENT: Negative.   Eyes: Negative for blurred vision.  Respiratory: Negative for cough, shortness of breath and wheezing.   Cardiovascular: Negative for chest pain and palpitations.  Gastrointestinal: as per HPI Genitourinary: Negative for dysuria, urgency, frequency and hematuria.  Musculoskeletal: Positive for myalgias, back pain and joint pain.  Skin: Negative for itching and rash.  Neurological: Negative for dizziness, tremors, focal weakness, seizures and loss of consciousness.  Endo/Heme/Allergies: Negative for seasonal allergies.  Positive for lymphedema Psychiatric/Behavioral: Negative for depression, suicidal ideas and hallucinations.  All other systems reviewed and are negative.   Physical Exam: Vitals:   12/29/15 0933  BP: 140/90  Pulse: 100   Body mass index is 24.08 kg/m. Gen:      No acute distress HEENT:  EOMI, sclera anicteric Neck:     No masses; no thyromegaly Lungs:    Clear to auscultation bilaterally; normal respiratory effort CV:         Regular rate and rhythm; no murmurs Abd:      + bowel sounds; soft, non-tender; no palpable masses, no distension Ext:    No edema; adequate peripheral perfusion Skin:      Warm and dry; no rash Neuro: alert and oriented x 3 Psych: normal mood and affect  Data Reviewed: Reviewed chart in epic, relevant GI work up as listed in HPI   Assessment and Plan/Recommendations:  65 year old female with history of breast cancer 2010 here with complaints of left upper quadrant abdominal pain associated with bloating radiating to left shoulder blade We'll refer to cardiology for evaluation of atypical chest pain and exclude  CAD History of fatty liver, check CMP and lipase ;patient is concerned about pancreatitis, I did reassure her that her symptoms are atypical for pancreatitis and seem less likely I also reassure patient that she did not have any ascites based on abdominal ultrasound and also based on my exam today She is past due for surveillance colonoscopy, we'll schedule it today Start probiotic VSL #3 112 units daily to improve bloating and flatus Greater than 50% of the time used for counseling as well as treatment plan and follow-up. She had multiple questions which were answered to her satisfaction  K. Denzil Magnuson , MD [phone] Mon-Fri 8a-5p [phone] after 5p, weekends, holidays  CC: No ref. provider found

## 2015-12-29 NOTE — Patient Instructions (Addendum)
You have been scheduled for a colonoscopy. Please follow written instructions given to you at your visit today.  Please pick up your prep supplies at the pharmacy within the next 1-3 days. If you use inhalers (even only as needed), please bring them with you on the day of your procedure. Your physician has requested that you go to www.startemmi.com and enter the access code given to you at your visit today. This web site gives a general overview about your procedure. However, you should still follow specific instructions given to you by our office regarding your preparation for the procedure.  Go to the basement for labs today   Use VSL #3 112 units daily   Your cardiology appointment is scheduled on 01/15/2016 at [facility] [address] am arrive at 8:30am  Bring photo ID, copay and meds

## 2015-12-29 NOTE — Telephone Encounter (Signed)
Returned patients call, she wants to keep appt and cardiology

## 2015-12-29 NOTE — Telephone Encounter (Signed)
patient states that [name] is not "in network" with her insurance. She is wanting to know if she can be referred to [facility]. She is requesting that our office cancel appt with [name] and schedule her with a physician at [facility]

## 2015-12-29 NOTE — Telephone Encounter (Signed)
Patient keeping appt at Dr Milderd Meager office at cardiology. Insurance is in Radiographer, therapeutic with patient and informed Dr Silverio Decamp

## 2016-01-15 DIAGNOSIS — R609 Edema, unspecified: Secondary | ICD-10-CM | POA: Diagnosis not present

## 2016-01-15 DIAGNOSIS — I10 Essential (primary) hypertension: Secondary | ICD-10-CM | POA: Diagnosis not present

## 2016-01-15 DIAGNOSIS — R0789 Other chest pain: Secondary | ICD-10-CM | POA: Diagnosis not present

## 2016-01-15 DIAGNOSIS — R0989 Other specified symptoms and signs involving the circulatory and respiratory systems: Secondary | ICD-10-CM | POA: Diagnosis not present

## 2016-01-26 DIAGNOSIS — G43909 Migraine, unspecified, not intractable, without status migrainosus: Secondary | ICD-10-CM | POA: Diagnosis not present

## 2016-01-26 DIAGNOSIS — Z6823 Body mass index (BMI) 23.0-23.9, adult: Secondary | ICD-10-CM | POA: Diagnosis not present

## 2016-01-26 DIAGNOSIS — C50919 Malignant neoplasm of unspecified site of unspecified female breast: Secondary | ICD-10-CM | POA: Diagnosis not present

## 2016-01-26 DIAGNOSIS — K760 Fatty (change of) liver, not elsewhere classified: Secondary | ICD-10-CM | POA: Diagnosis not present

## 2016-01-26 DIAGNOSIS — I7090 Unspecified atherosclerosis: Secondary | ICD-10-CM | POA: Diagnosis not present

## 2016-01-26 DIAGNOSIS — E038 Other specified hypothyroidism: Secondary | ICD-10-CM | POA: Diagnosis not present

## 2016-01-26 DIAGNOSIS — R600 Localized edema: Secondary | ICD-10-CM | POA: Diagnosis not present

## 2016-01-26 DIAGNOSIS — Z1389 Encounter for screening for other disorder: Secondary | ICD-10-CM | POA: Diagnosis not present

## 2016-01-26 DIAGNOSIS — E784 Other hyperlipidemia: Secondary | ICD-10-CM | POA: Diagnosis not present

## 2016-02-01 DIAGNOSIS — R0789 Other chest pain: Secondary | ICD-10-CM | POA: Diagnosis not present

## 2016-02-01 DIAGNOSIS — I10 Essential (primary) hypertension: Secondary | ICD-10-CM | POA: Diagnosis not present

## 2016-02-04 DIAGNOSIS — R0989 Other specified symptoms and signs involving the circulatory and respiratory systems: Secondary | ICD-10-CM | POA: Diagnosis not present

## 2016-02-04 DIAGNOSIS — R609 Edema, unspecified: Secondary | ICD-10-CM | POA: Diagnosis not present

## 2016-02-15 ENCOUNTER — Encounter: Payer: Self-pay | Admitting: Gastroenterology

## 2016-02-18 DIAGNOSIS — R0989 Other specified symptoms and signs involving the circulatory and respiratory systems: Secondary | ICD-10-CM | POA: Diagnosis not present

## 2016-02-18 DIAGNOSIS — R0789 Other chest pain: Secondary | ICD-10-CM | POA: Diagnosis not present

## 2016-02-18 DIAGNOSIS — R9431 Abnormal electrocardiogram [ECG] [EKG]: Secondary | ICD-10-CM | POA: Diagnosis not present

## 2016-02-18 DIAGNOSIS — R609 Edema, unspecified: Secondary | ICD-10-CM | POA: Diagnosis not present

## 2016-02-23 ENCOUNTER — Ambulatory Visit (AMBULATORY_SURGERY_CENTER): Payer: PPO | Admitting: Gastroenterology

## 2016-02-23 ENCOUNTER — Encounter: Payer: Self-pay | Admitting: Gastroenterology

## 2016-02-23 VITALS — BP 129/70 | HR 79 | Temp 97.8°F | Resp 20 | Ht 65.0 in | Wt 145.0 lb

## 2016-02-23 DIAGNOSIS — Z86010 Personal history of colonic polyps: Secondary | ICD-10-CM | POA: Diagnosis not present

## 2016-02-23 MED ORDER — SODIUM CHLORIDE 0.9 % IV SOLN: 500.0000 mL | INTRAVENOUS | Status: DC

## 2016-02-23 NOTE — Patient Instructions (Signed)
YOU HAD AN ENDOSCOPIC PROCEDURE TODAY AT [facility]:   Refer to the procedure report that was given to you for any specific questions about what was found during the examination.  If the procedure report does not answer your questions, please call your gastroenterologist to clarify.  If you requested that your care partner not be given the details of your procedure findings, then the procedure report has been included in a sealed envelope for you to review at your convenience later.  YOU SHOULD EXPECT: Some feelings of bloating in the abdomen. Passage of more gas than usual.  Walking can help get rid of the air that was put into your GI tract during the procedure and reduce the bloating. If you had a lower endoscopy (such as a colonoscopy or flexible sigmoidoscopy) you may notice spotting of blood in your stool or on the toilet paper. If you underwent a bowel prep for your procedure, you may not have a normal bowel movement for a few days.  Please Note:  You might notice some irritation and congestion in your nose or some drainage.  This is from the oxygen used during your procedure.  There is no need for concern and it should clear up in a day or so.  SYMPTOMS TO REPORT IMMEDIATELY:   Following lower endoscopy (colonoscopy or flexible sigmoidoscopy):  Excessive amounts of blood in the stool  Significant tenderness or worsening of abdominal pains  Swelling of the abdomen that is new, acute  Fever of 100F or higher   For urgent or emergent issues, a gastroenterologist can be reached at any hour by calling [phone].   DIET:  We do recommend a small meal at first, but then you may proceed to your regular diet.  Drink plenty of fluids but you should avoid alcoholic beverages for 24 hours.  ACTIVITY:  You should plan to take it easy for the rest of today and you should NOT DRIVE or use heavy machinery until tomorrow (because of the sedation medicines used during the test).     FOLLOW UP: Our staff will call the number listed on your records the next business day following your procedure to check on you and address any questions or concerns that you may have regarding the information given to you following your procedure. If we do not reach you, we will leave a message.  However, if you are feeling well and you are not experiencing any problems, there is no need to return our call.  We will assume that you have returned to your regular daily activities without incident.  If any biopsies were taken you will be contacted by phone or by letter within the next 1-3 weeks.  Please call us at [phone] if you have not heard about the biopsies in 3 weeks.    SIGNATURES/CONFIDENTIALITY: You and/or your care partner have signed paperwork which will be entered into your electronic medical record.  These signatures attest to the fact that that the information above on your After Visit Summary has been reviewed and is understood.  Full responsibility of the confidentiality of this discharge information lies with you and/or your care-partner.  Diverticulosis, hemorrhoids, high fiber diet-handouts given  Repeat colonoscopy in 5 years 2022.

## 2016-02-23 NOTE — Op Note (Signed)
[place] Patient Name: Brooke Wilkinson Procedure Date: 02/23/2016 1:17 PM MRN: [id number] Endoscopist: Mauri Pole , MD Age: 65 Referring MD:  Date of Birth: 1950/06/05 Gender: Female Account #: [id number] Procedure:                Colonoscopy Indications:              High risk colon cancer surveillance: Personal                            history of adenoma (10 mm or greater in size), Last                            colonoscopy: 2010 Medicines:                Monitored Anesthesia Care Procedure:                Pre-Anesthesia Assessment:                           - Prior to the procedure, a History and Physical                            was performed, and patient medications and                            allergies were reviewed. The patient's tolerance of                            previous anesthesia was also reviewed. The risks                            and benefits of the procedure and the sedation                            options and risks were discussed with the patient.                            All questions were answered, and informed consent                            was obtained. Prior Anticoagulants: The patient has                            taken no previous anticoagulant or antiplatelet                            agents. ASA Grade Assessment: II - A patient with                            mild systemic disease. After reviewing the risks                            and benefits, the patient was deemed in  satisfactory condition to undergo the procedure.                           After obtaining informed consent, the colonoscope                            was passed under direct vision. Throughout the                            procedure, the patient's blood pressure, pulse, and                            oxygen saturations were monitored continuously. The                            Model CF-[id number] scope  was introduced                            through the anus and advanced to the the terminal                            ileum, with identification of the appendiceal                            orifice and IC valve. The colonoscopy was performed                            without difficulty. The patient tolerated the                            procedure well. The quality of the bowel                            preparation was good. The terminal ileum, ileocecal                            valve, appendiceal orifice, and rectum were                            photographed. Scope In: 1:45:21 PM Scope Out: 2:03:37 PM Scope Withdrawal Time: 0 hours 12 minutes 37 seconds  Total Procedure Duration: 0 hours 18 minutes 16 seconds  Findings:                 The perianal and digital rectal examinations were                            normal.                           Multiple small and large-mouthed diverticula were                            found in the sigmoid colon, descending colon,  ascending colon and cecum.                           Non-bleeding internal hemorrhoids were found during                            retroflexion. The hemorrhoids were small.                           The exam was otherwise without abnormality. Complications:            No immediate complications. Estimated Blood Loss:     Estimated blood loss: none. Impression:               - Diverticulosis in the sigmoid colon, in the                            descending colon, in the ascending colon and in the                            cecum.                           - Non-bleeding internal hemorrhoids.                           - The examination was otherwise normal.                           - No specimens collected. Recommendation:           - Patient has a contact number available for                            emergencies. The signs and symptoms of potential                            delayed  complications were discussed with the                            patient. Return to normal activities tomorrow.                            Written discharge instructions were provided to the                            patient.                           - Resume previous diet.                           - Continue present medications.                           - Repeat colonoscopy in 5 years for surveillance  given history of advanced polyps. Mauri Pole, MD 02/23/2016 2:16:35 PM This report has been signed electronically.

## 2016-02-23 NOTE — Progress Notes (Signed)
Report given to PACU RN, vss 

## 2016-02-24 ENCOUNTER — Telehealth: Payer: Self-pay | Admitting: *Deleted

## 2016-02-24 NOTE — Telephone Encounter (Signed)
  Follow up Call-  Call back number 02/23/2016  Post procedure Call Back phone  # [phone]  Permission to leave phone message Yes  Some recent data might be hidden     No answer

## 2016-02-24 NOTE — Telephone Encounter (Signed)
  Follow up Call-  Call back number 02/23/2016  Post procedure Call Back phone  # [phone]  Permission to leave phone message Yes  Some recent data might be hidden     Patient questions:  Do you have a fever, pain , or abdominal swelling? No. Pain Score  0 *  Have you tolerated food without any problems? Yes.    Have you been able to return to your normal activities? Yes.    Do you have any questions about your discharge instructions: Diet   No. Medications  No. Follow up visit  No.  Do you have questions or concerns about your Care? No.  Actions: * If pain score is 4 or above: No action needed, pain <4.

## 2016-03-03 DIAGNOSIS — I10 Essential (primary) hypertension: Secondary | ICD-10-CM | POA: Diagnosis not present

## 2016-03-15 DIAGNOSIS — I7090 Unspecified atherosclerosis: Secondary | ICD-10-CM | POA: Diagnosis not present

## 2016-03-15 DIAGNOSIS — E784 Other hyperlipidemia: Secondary | ICD-10-CM | POA: Diagnosis not present

## 2016-03-15 DIAGNOSIS — E559 Vitamin D deficiency, unspecified: Secondary | ICD-10-CM | POA: Diagnosis not present

## 2016-03-15 DIAGNOSIS — I6529 Occlusion and stenosis of unspecified carotid artery: Secondary | ICD-10-CM | POA: Diagnosis not present

## 2016-03-15 DIAGNOSIS — R600 Localized edema: Secondary | ICD-10-CM | POA: Diagnosis not present

## 2016-03-15 DIAGNOSIS — Z6823 Body mass index (BMI) 23.0-23.9, adult: Secondary | ICD-10-CM | POA: Diagnosis not present

## 2016-03-15 DIAGNOSIS — E038 Other specified hypothyroidism: Secondary | ICD-10-CM | POA: Diagnosis not present

## 2016-03-15 DIAGNOSIS — E785 Hyperlipidemia, unspecified: Secondary | ICD-10-CM | POA: Diagnosis not present

## 2016-03-15 DIAGNOSIS — J3489 Other specified disorders of nose and nasal sinuses: Secondary | ICD-10-CM | POA: Diagnosis not present

## 2016-03-15 DIAGNOSIS — R0789 Other chest pain: Secondary | ICD-10-CM | POA: Diagnosis not present

## 2016-03-15 DIAGNOSIS — C50919 Malignant neoplasm of unspecified site of unspecified female breast: Secondary | ICD-10-CM | POA: Diagnosis not present

## 2016-04-22 DIAGNOSIS — L570 Actinic keratosis: Secondary | ICD-10-CM | POA: Diagnosis not present

## 2016-07-07 DIAGNOSIS — H524 Presbyopia: Secondary | ICD-10-CM | POA: Diagnosis not present

## 2016-07-26 ENCOUNTER — Encounter: Payer: Self-pay | Admitting: Certified Nurse Midwife

## 2016-07-26 ENCOUNTER — Ambulatory Visit (INDEPENDENT_AMBULATORY_CARE_PROVIDER_SITE_OTHER): Payer: PPO | Admitting: Certified Nurse Midwife

## 2016-07-26 VITALS — BP 124/76 | HR 70 | Resp 16 | Ht 65.25 in | Wt 144.0 lb

## 2016-07-26 DIAGNOSIS — N951 Menopausal and female climacteric states: Secondary | ICD-10-CM | POA: Diagnosis not present

## 2016-07-26 DIAGNOSIS — Z124 Encounter for screening for malignant neoplasm of cervix: Secondary | ICD-10-CM | POA: Diagnosis not present

## 2016-07-26 DIAGNOSIS — Z803 Family history of malignant neoplasm of breast: Secondary | ICD-10-CM | POA: Diagnosis not present

## 2016-07-26 DIAGNOSIS — Z01419 Encounter for gynecological examination (general) (routine) without abnormal findings: Secondary | ICD-10-CM

## 2016-07-26 NOTE — Patient Instructions (Signed)

## 2016-07-26 NOTE — Progress Notes (Signed)
66 y.o. G42P1001 Married  Caucasian Fe here for annual exam. Menopausal no HRT. Sees Cardiology for fluid retention and heart evaluation. Sees Dr. Ardeth Perfect PCP for aex, labs and medication management of cholesterol. 8 years out since breast cancer. Doing [place], not mammogram yearly, due to pain with. Seeing accupuncture for fluid balance and feels this has helped breast tenderness.. BMD previous low bone mass. Aware due this year. No other health issues today. New grandbaby now 61 months old! She and spouse downsizing to town home now.  No LMP recorded. Patient is postmenopausal.          Sexually active: Yes.    The current method of family planning is post menopausal status.    Exercising: No.  exercise Smoker:  no  Health Maintenance: Pap:  07-08-14 neg, 07-21-15 neg MMG:  6/17 u/s category 1 neg Colonoscopy: 2017 BMD:   2/15 osteopenia TDaP:  2017 Shingles: no Pneumonia: no Hep C and HIV: not done Labs: none Self breast exam: done monthly   reports that she has never smoked. She has never used smokeless tobacco. She reports that she does not drink alcohol or use drugs.  Past Medical History:  Diagnosis Date  . Adenomatous colon polyp   . Breast cancer ([place])    Right breast-Invasive ductal  . Diverticulosis   . Ductal carcinoma of breast, stage 1 ([place]) 2010  . Dyslipidemia   . Fatty liver 05/05/2011   Ultrasound   . GERD (gastroesophageal reflux disease)   . Hiatal hernia   . Hx of radiation therapy 12/02/08- 12/08/08   right breast mammosite, 34 Gy 10 fractions, given twice daily  . MVP (mitral valve prolapse)    No antibiotics required for procedures  . Osteopenia   . Thyroid disease     Past Surgical History:  Procedure Laterality Date  . BREAST BIOPSY  2015  . BREAST SURGERY  2010   Lumpectomy; Right- Mammocyte  . PELVIC LAPAROSCOPY      Current Outpatient Prescriptions  Medication Sig Dispense Refill  . Ascorbic Acid (VITAMIN C PO) Take by mouth.      Marland Kitchen b complex  vitamins tablet Take 1 tablet by mouth daily.    . Cholecalciferol (VITAMIN D PO) Take 5,000 Units by mouth 2 (two) times daily.      Marland Kitchen liothyronine (CYTOMEL) 5 MCG tablet Take 5 mcg by mouth daily.     Marland Kitchen NALTREXONE HCL PO Take 4.5 mg by mouth daily.     . Omega-3 Fatty Acids (FISH OIL) 1200 MG CAPS Take 1 capsule by mouth daily.    Marland Kitchen spironolactone (ALDACTONE) 25 MG tablet Take 12.5 tablets by mouth every morning.    Marland Kitchen UNABLE TO FIND 12.5 mg. Med Name:Iodoral    . UNABLE TO FIND at bedtime. Liver support     Current Facility-Administered Medications  Medication Dose Route Frequency Provider Last Rate Last Dose  . 0.9 %  sodium chloride infusion  500 mL Intravenous Continuous Kavitha Nandigam V, MD      . 0.9 %  sodium chloride infusion  500 mL Intravenous Continuous Mauri Pole, MD        Family History  Problem Relation Age of Onset  . Diabetes Mother     Type 1  . Hypertension Mother   . Heart disease Mother   . Osteoporosis Sister   . Alcohol abuse Father   . Breast cancer Maternal Grandmother   . Colon cancer Neg Hx  ROS:  Pertinent items are noted in HPI.  Otherwise, a comprehensive ROS was negative.  Exam:   There were no vitals taken for this visit.   Ht Readings from Last 3 Encounters:  02/23/16 5\' 5"  (1.651 m)  12/29/15 5' 5.25" (1.657 m)  12/09/15 5' 5.25" (1.657 m)    General appearance: alert, cooperative and appears stated age Head: Normocephalic, without obvious abnormality, atraumatic Neck: no adenopathy, supple, symmetrical, trachea midline and thyroid normal to inspection and palpation Lungs: clear to auscultation bilaterally Breasts: normal appearance, no masses or tenderness, No nipple retraction or dimpling, No nipple discharge or bleeding, No axillary or supraclavicular adenopathy Heart: regular rate and rhythm Abdomen: soft, non-tender; no masses,  no organomegaly Extremities: extremities normal, atraumatic, no cyanosis or edema Skin: Skin  color, texture, turgor normal. No rashes or lesions Lymph nodes: Cervical, supraclavicular, and axillary nodes normal. No abnormal inguinal nodes palpated Neurologic: Grossly normal   Pelvic: External genitalia:  no lesions              Urethra:  normal appearing urethra with no masses, tenderness or lesions              Bartholin's and Skene's: normal                 Vagina: normal appearing vagina with normal color and discharge, no lesions              Cervix: no bleeding following Pap, no cervical motion tenderness and no lesions              Pap taken: Yes.   Bimanual Exam:  Uterus:  normal size, contour, position, consistency, mobility, non-tender              Adnexa: normal adnexa and no mass, fullness, tenderness               Rectovaginal: Confirms               Anus:  normal sphincter tone, no lesions  Chaperone present: yes  A:  Well Woman with normal exam  Post menopausal no HRT  History of Breast cancer right breast, 8 years out now  Hypothyroid with PCP management  Osteopenia , BMD due  P:   Reviewed health and wellness pertinent to exam  Aware of need to evaluate if vaginal bleeding  Stressed importance of mammogram, SBE  Continue MD follow up as indicated  Patient will call and schedule BMD  Pap smear as above    counseled on breast self exam, mammography screening, feminine hygiene, osteoporosis, adequate intake of calcium and vitamin D, diet and exercise  return annually or prn  An After Visit Summary was printed and given to the patient.

## 2016-07-27 LAB — IPS PAP SMEAR ONLY

## 2016-08-06 NOTE — Progress Notes (Signed)
Encounter reviewed Jill Jertson, MD   

## 2016-10-04 ENCOUNTER — Encounter: Payer: Self-pay | Admitting: Cardiology

## 2016-10-04 DIAGNOSIS — E559 Vitamin D deficiency, unspecified: Secondary | ICD-10-CM | POA: Diagnosis not present

## 2016-10-04 DIAGNOSIS — Z0000 Encounter for general adult medical examination without abnormal findings: Secondary | ICD-10-CM | POA: Diagnosis not present

## 2016-10-04 DIAGNOSIS — E784 Other hyperlipidemia: Secondary | ICD-10-CM | POA: Diagnosis not present

## 2016-10-04 DIAGNOSIS — E039 Hypothyroidism, unspecified: Secondary | ICD-10-CM | POA: Diagnosis not present

## 2016-10-11 DIAGNOSIS — I6529 Occlusion and stenosis of unspecified carotid artery: Secondary | ICD-10-CM | POA: Diagnosis not present

## 2016-10-11 DIAGNOSIS — E559 Vitamin D deficiency, unspecified: Secondary | ICD-10-CM | POA: Diagnosis not present

## 2016-10-11 DIAGNOSIS — Z6823 Body mass index (BMI) 23.0-23.9, adult: Secondary | ICD-10-CM | POA: Diagnosis not present

## 2016-10-11 DIAGNOSIS — I7090 Unspecified atherosclerosis: Secondary | ICD-10-CM | POA: Diagnosis not present

## 2016-10-11 DIAGNOSIS — E784 Other hyperlipidemia: Secondary | ICD-10-CM | POA: Diagnosis not present

## 2016-10-11 DIAGNOSIS — Z1389 Encounter for screening for other disorder: Secondary | ICD-10-CM | POA: Diagnosis not present

## 2016-10-11 DIAGNOSIS — K760 Fatty (change of) liver, not elsewhere classified: Secondary | ICD-10-CM | POA: Diagnosis not present

## 2016-10-11 DIAGNOSIS — G43909 Migraine, unspecified, not intractable, without status migrainosus: Secondary | ICD-10-CM | POA: Diagnosis not present

## 2016-10-11 DIAGNOSIS — Z0000 Encounter for general adult medical examination without abnormal findings: Secondary | ICD-10-CM | POA: Diagnosis not present

## 2016-10-11 DIAGNOSIS — C50919 Malignant neoplasm of unspecified site of unspecified female breast: Secondary | ICD-10-CM | POA: Diagnosis not present

## 2016-10-11 DIAGNOSIS — E039 Hypothyroidism, unspecified: Secondary | ICD-10-CM | POA: Diagnosis not present

## 2016-10-11 DIAGNOSIS — R52 Pain, unspecified: Secondary | ICD-10-CM | POA: Diagnosis not present

## 2016-10-12 DIAGNOSIS — Z1212 Encounter for screening for malignant neoplasm of rectum: Secondary | ICD-10-CM | POA: Diagnosis not present

## 2017-02-17 DIAGNOSIS — D485 Neoplasm of uncertain behavior of skin: Secondary | ICD-10-CM | POA: Diagnosis not present

## 2017-02-17 DIAGNOSIS — L570 Actinic keratosis: Secondary | ICD-10-CM | POA: Diagnosis not present

## 2017-02-17 DIAGNOSIS — D2239 Melanocytic nevi of other parts of face: Secondary | ICD-10-CM | POA: Diagnosis not present

## 2017-03-23 DIAGNOSIS — E038 Other specified hypothyroidism: Secondary | ICD-10-CM | POA: Diagnosis not present

## 2017-03-23 DIAGNOSIS — E559 Vitamin D deficiency, unspecified: Secondary | ICD-10-CM | POA: Diagnosis not present

## 2017-04-05 ENCOUNTER — Ambulatory Visit: Payer: PPO | Admitting: Cardiovascular Disease

## 2017-04-05 ENCOUNTER — Encounter: Payer: Self-pay | Admitting: Cardiovascular Disease

## 2017-04-05 VITALS — BP 178/84 | HR 102 | Ht 65.5 in | Wt 146.6 lb

## 2017-04-05 DIAGNOSIS — E7800 Pure hypercholesterolemia, unspecified: Secondary | ICD-10-CM | POA: Diagnosis not present

## 2017-04-05 DIAGNOSIS — I341 Nonrheumatic mitral (valve) prolapse: Secondary | ICD-10-CM | POA: Diagnosis not present

## 2017-04-05 DIAGNOSIS — I351 Nonrheumatic aortic (valve) insufficiency: Secondary | ICD-10-CM | POA: Diagnosis not present

## 2017-04-05 DIAGNOSIS — I6521 Occlusion and stenosis of right carotid artery: Secondary | ICD-10-CM

## 2017-04-05 DIAGNOSIS — E039 Hypothyroidism, unspecified: Secondary | ICD-10-CM | POA: Diagnosis not present

## 2017-04-05 DIAGNOSIS — I6529 Occlusion and stenosis of unspecified carotid artery: Secondary | ICD-10-CM

## 2017-04-05 DIAGNOSIS — I2510 Atherosclerotic heart disease of native coronary artery without angina pectoris: Secondary | ICD-10-CM

## 2017-04-05 DIAGNOSIS — I10 Essential (primary) hypertension: Secondary | ICD-10-CM | POA: Diagnosis not present

## 2017-04-05 NOTE — Patient Instructions (Signed)
Medication Instructions:  Your physician recommends that you continue on your current medications as directed. Please refer to the Current Medication list given to you today.  Labwork: NONE  Testing/Procedures: Your physician has requested that you have a carotid duplex. This test is an ultrasound of the carotid arteries in your neck. It looks at blood flow through these arteries that supply the brain with blood. Allow one hour for this exam. There are no restrictions or special instructions.  Follow-Up: Your physician recommends that you schedule a follow-up appointment in: 6 WEEKS  Any Other Special Instructions Will Be Listed Below (If Applicable). LOG YOUR BLOOD PRESSURES AND BRING TO FOLLOW UP   CONSIDER STARTING ROSUVASTATIN (CRESTOR) 10 MG DAILY   If you need a refill on your cardiac medications before your next appointment, please call your pharmacy.

## 2017-04-05 NOTE — Progress Notes (Signed)
Cardiology Office Note   Date:  04/09/2017   ID:  MCKYNZI CAMMON, DOB 10-09-1950, MRN [id number]  PCP:  Velna Hatchet, MD  Cardiologist:   Skeet Latch, MD   No chief complaint on file.     History of Present Illness: Brooke Wilkinson is a 66 y.o. female with carotid stenosis, hyperlipidemia (refuses statins), non-obstructive CAD, aortic regurgitation, mitral valve prolapse, breast cancer s/p lumpectomy, hypothyroidism, hyperlipidemia, migraines, who is being seen today for the evaluation of chest pain at the request of Velna Hatchet, MD.  Brooke Wilkinson was previously a patient of [name].  She last saw [name] 02/2016 and reported atypical chest pain.  EKG reportedly revealed sinus rhythm with nonspecific ST depression and could not exclude inferior and anterolateral ischemia.She had carotid Dopplers 01/2016 that revealed 18-49% right ICA stenosis.  She had an echo at the same time that revealed LVEF 67% with normal diastolic function, mild to moderate aortic regurgitation, mild prolapse of the anterior mitral valve leaflet with mild to moderate posteriorly directed mitral regurgitation, mild tricuspid regurgitation, and mild pulmonic regurgitation.  She has known hyperlipidemia and has refused statin treatment.  She has family members who developed myalgias and memory difficulty on statins.  She saw her primary care provider [facility], 02/2017 and did accept the addition of flaxseed and Co-Q10.   Brooke Wilkinson was diagnosed with breast cancer around 2010.  She underwent lumpectomy and XRT.  She refused the recommended chemotherapy.  She sites this as evidence that not all prescribed medications are needed. Since her treatment she developed a hiatal hernia and lower esophageal sphincter dysfunction.  She has also suffered with truncal edema and fluid retention in her abdomen.  This was her initial readon for presentation to [name].  Brooke Wilkinson has several family members  with very elevated cholesterol levels and many with cardiovascular disease.  She denies chest pain or shortness of breath.  She has been working on her diet to reduce her lipids.  She has not been exercising lately.      Past Medical History:  Diagnosis Date  . Adenomatous colon polyp   . Aortic regurgitation 04/09/2017  . Breast cancer ([place])    Right breast-Invasive ductal  . Carotid stenosis 04/09/2017  . Diverticulosis   . Ductal carcinoma of breast, stage 1 ([place]) 2010  . Dyslipidemia   . Essential hypertension 04/09/2017  . Fatty liver 05/05/2011   Ultrasound   . GERD (gastroesophageal reflux disease)   . Hiatal hernia   . Hx of radiation therapy 12/02/08- 12/08/08   right breast mammosite, 34 Gy 10 fractions, given twice daily  . Hyperlipidemia 04/09/2017  . Mitral and aortic regurgitation   . MVP (mitral valve prolapse)    No antibiotics required for procedures  . Osteopenia   . Thyroid disease     Past Surgical History:  Procedure Laterality Date  . BREAST BIOPSY  2015  . BREAST SURGERY  2010   Lumpectomy; Right- Mammocyte  . PELVIC LAPAROSCOPY       Current Outpatient Medications  Medication Sig Dispense Refill  . Ascorbic Acid (VITAMIN C PO) Take by mouth.      Marland Kitchen b complex vitamins tablet Take 1 tablet by mouth daily.    . Cholecalciferol (VITAMIN D PO) Take 5,000 Units by mouth daily.     . Coenzyme Q10 (CO Q-10) 100 MG CAPS Take 1 capsule by mouth daily.    . Cyanocobalamin (VITAMIN B-12) 1000 MCG  SUBL Place 1 tablet under the tongue daily.    Marland Kitchen liothyronine (CYTOMEL) 5 MCG tablet Take 5 mcg by mouth daily.     . Magnesium 400 MG TABS Take 1 tablet by mouth 2 (two) times daily.    . Misc Natural Products (TURMERIC CURCUMIN) CAPS Take 1 capsule by mouth daily.    . Multiple Vitamin (MULTIVITAMIN WITH MINERALS) TABS tablet Take 1 tablet by mouth daily.    Marland Kitchen NALTREXONE HCL PO Take 4.5 mg by mouth daily.     . Omega-3 Fatty Acids (FISH OIL) 1200 MG CAPS Take 1  capsule by mouth daily.    Marland Kitchen UNABLE TO FIND at bedtime. Liver support     Current Facility-Administered Medications  Medication Dose Route Frequency Provider Last Rate Last Dose  . 0.9 %  sodium chloride infusion  500 mL Intravenous Continuous Nandigam, Kavitha V, MD      . 0.9 %  sodium chloride infusion  500 mL Intravenous Continuous Nandigam, Venia Minks, MD        Allergies:   Erythromycin; Penicillins; and Tetracycline    Social History:  The patient  reports that  has never smoked. she has never used smokeless tobacco. She reports that she does not drink alcohol or use drugs.   Family History:  The patient's family history includes Alcohol abuse in her father; Arthritis in her mother; Breast cancer in her maternal grandmother; CAD in her mother; COPD in her sister; Diabetes in her mother; Gallstones in her mother; Heart attack in her maternal grandfather; Heart disease in her maternal grandfather and mother; Hypertension in her mother; Kidney cancer in her paternal grandfather; Osteopenia in her sister; Osteoporosis in her sister; Stroke in her mother; Thyroid disease in her mother; Ulcerative colitis in her daughter; Valvular heart disease in her mother.    ROS:  Please see the history of present illness.   Otherwise, review of systems are positive for none.   All other systems are reviewed and negative.    PHYSICAL EXAM: VS:  BP (!) 178/84   Pulse (!) 102   Ht 5' 5.5" (1.664 m)   Wt 146 lb 9.6 oz (66.5 kg)   BMI 24.02 kg/m  , BMI Body mass index is 24.02 kg/m. GENERAL:  Well appearing HEENT:  Pupils equal round and reactive, fundi not visualized, oral mucosa unremarkable NECK:  No jugular venous distention, waveform within normal limits, carotid upstroke brisk and symmetric, no bruits, no thyromegaly LYMPHATICS:  No cervical adenopathy LUNGS:  Clear to auscultation bilaterally HEART:  RRR.  PMI not displaced or sustained,S1 and S2 within normal limits, no S3, no S4, no clicks,  no rubs, no murmurs ABD:  Flat, positive bowel sounds normal in frequency in pitch, no bruits, no rebound, no guarding, no midline pulsatile mass, no hepatomegaly, no splenomegaly EXT:  2 plus pulses throughout, no edema, no cyanosis no clubbing SKIN:  No rashes no nodules NEURO:  Cranial nerves II through XII grossly intact, motor grossly intact throughout PSYCH:  Cognitively intact, oriented to person place and time    EKG:  EKG is ordered today. The ekg ordered today demonstrates sinus tachycardia.  Rate 102 bpm.    Carotid Dopplers 02/05/16: 16-49% right ICA stenosis  Echo 02/05/16: LVEF 67%.  Normal diastolic function. Mild to moderate aortic regurgitation.  Mild prolapse of the anterior mitral valve leaflet.  Mild to moderate posteriorly directed mitral regurgitation.  Mild tricuspid regurgitation.  No pulmonary hypertension.  Mild pulmonic regurgitation.  Recent Labs: No results found for requested labs within last 8760 hours.   02/01/16: Sodium 142, potassium 4.5, BUN 13, creatinine 1.06 TSH 0.76 Free T4 5.6  Lipid Panel    Component Value Date/Time   CHOL 280 (H) 07/08/2014 1425   TRIG 199 (H) 07/08/2014 1425   HDL 53 07/08/2014 1425   CHOLHDL 5.3 07/08/2014 1425   VLDL 40 07/08/2014 1425   LDLCALC 187 (H) 07/08/2014 1425      Wt Readings from Last 3 Encounters:  04/05/17 146 lb 9.6 oz (66.5 kg)  07/26/16 144 lb (65.3 kg)  02/23/16 145 lb (65.8 kg)      ASSESSMENT AND PLAN:  # Carotid Stenosis:  # Hyperlipidemia:  # Asymptomatic coronary calcification: 15-49% R ICA stenosis in 2017.  We will repeat carotid Dopplers.  Recommended that she start a statin but she refuses.  After 30 minutes of discussion she will think about whether she would be willing to try one. We discussed trying rosuvastatin 10mg  although I would typically start 40mg  in her situation.  Also recommended that she start aspirin 81mg  daily.  Continue fish oil for now.   # Hypertension:  Blood pressure very elevated.  She attributes this to being in a new environment.  She will check her BP at home, record it, and bring to follow up.  # Mild-moderate aortic regurgitation: # Mitral valve prolapse:  Brooke Wilkinson is euvolemic and asymptomatic.   Current medicines are reviewed at length with the patient today.  The patient does not have concerns regarding medicines.  The following changes have been made:  Aspirin 81mg  daily   Labs/ tests ordered today include:  No orders of the defined types were placed in this encounter.    Disposition:   FU with Orine Goga C. Oval Linsey, MD, [facility]     This note was written with the assistance of speech recognition software.  Please excuse any transcriptional errors.  Signed, Murel Wigle C. Oval Linsey, MD, [facility]  04/09/2017 7:24 PM    [place]

## 2017-04-09 ENCOUNTER — Encounter: Payer: Self-pay | Admitting: Cardiovascular Disease

## 2017-04-09 DIAGNOSIS — I6529 Occlusion and stenosis of unspecified carotid artery: Secondary | ICD-10-CM

## 2017-04-09 DIAGNOSIS — I351 Nonrheumatic aortic (valve) insufficiency: Secondary | ICD-10-CM

## 2017-04-09 DIAGNOSIS — E785 Hyperlipidemia, unspecified: Secondary | ICD-10-CM | POA: Insufficient documentation

## 2017-04-09 DIAGNOSIS — I10 Essential (primary) hypertension: Secondary | ICD-10-CM

## 2017-04-09 HISTORY — DX: Nonrheumatic aortic (valve) insufficiency: I35.1

## 2017-04-09 HISTORY — DX: Essential (primary) hypertension: I10

## 2017-04-09 HISTORY — DX: Occlusion and stenosis of unspecified carotid artery: I65.29

## 2017-04-09 HISTORY — DX: Hyperlipidemia, unspecified: E78.5

## 2017-04-25 ENCOUNTER — Inpatient Hospital Stay (HOSPITAL_COMMUNITY): Admission: RE | Admit: 2017-04-25 | Payer: PPO | Source: Ambulatory Visit

## 2017-05-23 ENCOUNTER — Ambulatory Visit (HOSPITAL_COMMUNITY)
Admission: RE | Admit: 2017-05-23 | Discharge: 2017-05-23 | Disposition: A | Discharge status: Home / Self Care | Payer: PPO | Source: Ambulatory Visit | Attending: Cardiology | Admitting: Cardiology

## 2017-05-23 DIAGNOSIS — I6523 Occlusion and stenosis of bilateral carotid arteries: Secondary | ICD-10-CM | POA: Diagnosis not present

## 2017-05-23 DIAGNOSIS — I6529 Occlusion and stenosis of unspecified carotid artery: Secondary | ICD-10-CM | POA: Diagnosis not present

## 2017-06-01 ENCOUNTER — Telehealth: Payer: Self-pay | Admitting: *Deleted

## 2017-06-01 NOTE — Telephone Encounter (Signed)
-----   Message from Skeet Latch, MD sent at 05/24/2017  3:42 PM EST ----- Mild blockage in bilateral carotid arteries.  Recommend aspirin 81 mg and rosuvastatin 10 mg as previously discussed.  Check lipids and CMP in 6 weeks.

## 2017-06-01 NOTE — Telephone Encounter (Signed)
Notes recorded by Waylan Rocher, LPN on 11/19/938 at 7:68 PM EST Pt emphatically states that SHE WILL NOT TAKE ROSUVASTATIN 10MG , or any other cholesterol medications, she will start the asa 81mg  but she states that she will discuss this at upcoming appt with MD about starting a holistic treatment.

## 2017-06-14 ENCOUNTER — Encounter: Payer: Self-pay | Admitting: Cardiovascular Disease

## 2017-06-14 ENCOUNTER — Ambulatory Visit (INDEPENDENT_AMBULATORY_CARE_PROVIDER_SITE_OTHER): Payer: PPO | Admitting: Cardiovascular Disease

## 2017-06-14 VITALS — Ht 65.5 in | Wt 145.8 lb

## 2017-06-14 DIAGNOSIS — I10 Essential (primary) hypertension: Secondary | ICD-10-CM

## 2017-06-14 DIAGNOSIS — I341 Nonrheumatic mitral (valve) prolapse: Secondary | ICD-10-CM

## 2017-06-14 DIAGNOSIS — E7800 Pure hypercholesterolemia, unspecified: Secondary | ICD-10-CM

## 2017-06-14 DIAGNOSIS — I6529 Occlusion and stenosis of unspecified carotid artery: Secondary | ICD-10-CM | POA: Diagnosis not present

## 2017-06-14 NOTE — Patient Instructions (Signed)
Medication Instructions:  Your physician recommends that you continue on your current medications as directed. Please refer to the Current Medication list given to you today.  Labwork: NONE  Testing/Procedures: Your physician has requested that you have a carotid duplex. This test is an ultrasound of the carotid arteries in your neck. It looks at blood flow through these arteries that supply the brain with blood. Allow one hour for this exam. There are no restrictions or special instructions. 1 YEAR   Follow-Up: Your physician wants you to follow-up in: [place] will receive a reminder letter in the mail two months in advance. If you don't receive a letter, please call our office to schedule the follow-up appointment.  If you need a refill on your cardiac medications before your next appointment, please call your pharmacy.

## 2017-06-14 NOTE — Progress Notes (Signed)
Cardiology Office Note   Date:  06/14/2017   ID:  SHELSIE Wilkinson, DOB 1951-01-06, MRN [id number]  PCP:  Brooke Hatchet, MD  Cardiologist:   Skeet Latch, MD   Chief Complaint  Patient presents with  . Follow-up    6 weeks       History of Present Illness: Brooke Wilkinson is a 67 y.o. female with carotid stenosis, hyperlipidemia (refuses statins), non-obstructive CAD, aortic regurgitation, mitral valve prolapse, breast cancer s/p lumpectomy, hypothyroidism, hyperlipidemia, migraines, who is being seen today for the evaluation of chest pain at the request of Brooke Hatchet, MD.  Brooke Wilkinson was previously a patient of Brooke Wilkinson.  She last saw Brooke Wilkinson 02/2016 and reported atypical chest pain.  EKG reportedly revealed sinus rhythm with nonspecific ST depression and could not exclude inferior and anterolateral ischemia.She had carotid Dopplers 01/2016 that revealed 18-49% right ICA stenosis.  She had an echo at the same time that revealed LVEF 67% with normal diastolic function, mild to moderate aortic regurgitation, mild prolapse of the anterior mitral valve leaflet with mild to moderate posteriorly directed mitral regurgitation, mild tricuspid regurgitation, and mild pulmonic regurgitation.  She has known hyperlipidemia and has refused statin treatment.  She has family members who developed myalgias and memory difficulty on statins.  She saw her primary care provider [facility], 02/2017 and did accept the addition of flaxseed and Co-Q10.   Brooke Wilkinson was diagnosed with breast cancer around 2010.  She underwent lumpectomy and XRT.  She refused the recommended chemotherapy.  She sites this as evidence that not all prescribed medications are needed. Since her treatment she developed a hiatal hernia and lower esophageal sphincter dysfunction.  She has also suffered with truncal edema and fluid retention in her abdomen.  This was her initial reason for presentation to Brooke Wilkinson.   Brooke Wilkinson has several family members with very elevated cholesterol levels and many with cardiovascular disease.  She has declined statins and has been working on diet and exercise.  Since her last appointment she started exercising regularly.  She is up to 4 days per week.  She goes on the elliptical for one hour and feels good.  She has no chest pain or shortness of breath with this activity.  She has been monitoring her blood pressure at home.  At the highest it was 140/80.  On average it is 132/72.  She has not experienced any lower extremity edema, orthopnea, or PND.  Her main complaint today is seasonal allergies that have been bothering her for the last several days.  She has been using over-the-counter cold and cough medication and knows that her blood pressure has been elevated due to this.  Therefore she declines having her blood pressure checked today.  She is also been working on her diet and eliminating sugar.  She has been adding back healthy fats and oils.  She is also been taking a concoction of over-the-counter supplements including turmeric, ginger, stage, and vitamin C.  She plans to have her cholesterol rechecked with her primary care provider this summer.  Since her last appointment she had carotid Dopplers 05/23/17 that revealed 1-39% ICA stenosis bilaterally.   Past Medical History:  Diagnosis Date  . Adenomatous colon polyp   . Aortic regurgitation 04/09/2017  . Breast cancer ([place])    Right breast-Invasive ductal  . Carotid stenosis 04/09/2017  . Diverticulosis   . Ductal carcinoma of breast, stage 1 ([place]) 2010  . Dyslipidemia   .  Essential hypertension 04/09/2017  . Fatty liver 05/05/2011   Ultrasound   . GERD (gastroesophageal reflux disease)   . Hiatal hernia   . Hx of radiation therapy 12/02/08- 12/08/08   right breast mammosite, 34 Gy 10 fractions, given twice daily  . Hyperlipidemia 04/09/2017  . Mitral and aortic regurgitation   . MVP (mitral valve prolapse)    No  antibiotics required for procedures  . Osteopenia   . Thyroid disease     Past Surgical History:  Procedure Laterality Date  . BREAST BIOPSY  2015  . BREAST SURGERY  2010   Lumpectomy; Right- Mammocyte  . PELVIC LAPAROSCOPY       Current Outpatient Medications  Medication Sig Dispense Refill  . Ascorbic Acid (VITAMIN C PO) Take by mouth.      Marland Kitchen aspirin EC 81 MG tablet Take 81 mg by mouth daily.    Marland Kitchen b complex vitamins tablet Take 1 tablet by mouth daily.    . Cholecalciferol (VITAMIN D PO) Take 5,000 Units by mouth daily.     . Coenzyme Q10 (CO Q-10) 100 MG CAPS Take 1 capsule by mouth daily.    . Cyanocobalamin (VITAMIN B-12) 1000 MCG SUBL Place 1 tablet under the tongue daily.    Marland Kitchen liothyronine (CYTOMEL) 5 MCG tablet Take 5 mcg by mouth daily.     . Magnesium 400 MG TABS Take 1 tablet by mouth 2 (two) times daily.    . Misc Natural Products (TURMERIC CURCUMIN) CAPS Take 1 capsule by mouth daily.    . Multiple Vitamin (MULTIVITAMIN WITH MINERALS) TABS tablet Take 1 tablet by mouth daily.    Marland Kitchen NALTREXONE HCL PO Take 4.5 mg by mouth daily.     . Omega-3 Fatty Acids (FISH OIL) 1200 MG CAPS Take 1 capsule by mouth daily.    Marland Kitchen UNABLE TO FIND at bedtime. Liver support     Current Facility-Administered Medications  Medication Dose Route Frequency Provider Last Rate Last Dose  . 0.9 %  sodium chloride infusion  500 mL Intravenous Continuous Nandigam, Kavitha V, MD      . 0.9 %  sodium chloride infusion  500 mL Intravenous Continuous Nandigam, Venia Minks, MD        Allergies:   Erythromycin; Penicillins; and Tetracycline    Social History:  The patient  reports that  has never smoked. she has never used smokeless tobacco. She reports that she does not drink alcohol or use drugs.   Family History:  The patient's family history includes Alcohol abuse in her father; Arthritis in her mother; Breast cancer in her maternal grandmother; CAD in her mother; COPD in her sister; Diabetes in her  mother; Gallstones in her mother; Heart attack in her maternal grandfather; Heart disease in her maternal grandfather and mother; Hypertension in her mother; Kidney cancer in her paternal grandfather; Osteopenia in her sister; Osteoporosis in her sister; Stroke in her mother; Thyroid disease in her mother; Ulcerative colitis in her daughter; Valvular heart disease in her mother.    ROS:  Please see the history of present illness.   Otherwise, review of systems are positive for none.   All other systems are reviewed and negative.    PHYSICAL EXAM: VS:  Ht 5' 5.5" (1.664 m)   Wt 145 lb 12.8 oz (66.1 kg)   BMI 23.89 kg/m  , BMI Body mass index is 23.89 kg/m. GENERAL:  Well appearing.  No acute distress.  HEENT: Pupils equal round and reactive, fundi  not visualized, oral mucosa unremarkable NECK:  No jugular venous distention, waveform within normal limits, carotid upstroke brisk and symmetric, no bruits, no thyromegaly LYMPHATICS:  No cervical adenopathy LUNGS:  Clear to auscultation bilaterally HEART:  RRR.  PMI not displaced or sustained,S1 and S2 within normal limits, no S3, no S4, no clicks, no rubs, no murmurs ABD:  Flat, positive bowel sounds normal in frequency in pitch, no bruits, no rebound, no guarding, no midline pulsatile mass, no hepatomegaly, no splenomegaly EXT:  2 plus pulses throughout, no edema, no cyanosis no clubbing SKIN:  No rashes no nodules NEURO:  Cranial nerves II through XII grossly intact, motor grossly intact throughout PSYCH:  Cognitively intact, oriented to person place and time   EKG:  EKG is not ordered today. The ekg ordered 04/05/17 demonstrates sinus tachycardia.  Rate 102 bpm.    Carotid Dopplers 02/05/16: 16-49% right ICA stenosis  Carotid Dopplers 05/2017: 1-39% ICA stenosis bilaterally.  Echo 02/05/16: LVEF 67%.  Normal diastolic function. Mild to moderate aortic regurgitation.  Mild prolapse of the anterior mitral valve leaflet.  Mild to  moderate posteriorly directed mitral regurgitation.  Mild tricuspid regurgitation.  No pulmonary hypertension.  Mild pulmonic regurgitation.  Recent Labs: No results found for requested labs within last 8760 hours.   02/01/16: Sodium 142, potassium 4.5, BUN 13, creatinine 1.06 TSH 0.76 Free T4 5.6  Lipid Panel    Component Value Date/Time   CHOL 280 (H) 07/08/2014 1425   TRIG 199 (H) 07/08/2014 1425   HDL 53 07/08/2014 1425   CHOLHDL 5.3 07/08/2014 1425   VLDL 40 07/08/2014 1425   LDLCALC 187 (H) 07/08/2014 1425      Wt Readings from Last 3 Encounters:  06/14/17 145 lb 12.8 oz (66.1 kg)  04/05/17 146 lb 9.6 oz (66.5 kg)  07/26/16 144 lb (65.3 kg)      ASSESSMENT AND PLAN:  # Carotid Stenosis:  # Hyperlipidemia:  # Asymptomatic coronary calcification: 15-49% R ICA stenosis in 2017.  On repeat Doppler she had 1-39% ICA stenosis bilaterally.  Continue aspirin 81 mg.  We recommended considering a statin but she declines.  She is taking over-the-counter supplements, exercising, and working on diet.  She plans to follow-up with her PCP this summer to have a repeat lipids tested.  We will repeat her carotid Dopplers 05/2018.  # Hypertension: Blood pressure is reportedly well-controlled at home.  She refuses to have it checked today.  # Mild-moderate aortic regurgitation: # Mitral valve prolapse:  Ms. Zulueta is euvolemic and asymptomatic.   Current medicines are reviewed at length with the patient today.  The patient does not have concerns regarding medicines.  The following changes have been made:  none  Labs/ tests ordered today include:  No orders of the defined types were placed in this encounter.  Time spent: 25 minutes-Greater than 50% of this time was spent in counseling, explanation of diagnosis, planning of further management, and coordination of care.   Disposition:   FU with Pierce Barocio C. Oval Linsey, MD, [facility] in 1 year.   This note was written with the assistance  of speech recognition software.  Please excuse any transcriptional errors.  Signed, Rembert Browe C. Oval Linsey, MD, [facility]  06/14/2017 10:15 AM    [place]

## 2017-06-27 DIAGNOSIS — H524 Presbyopia: Secondary | ICD-10-CM | POA: Diagnosis not present

## 2017-07-06 DIAGNOSIS — J029 Acute pharyngitis, unspecified: Secondary | ICD-10-CM | POA: Diagnosis not present

## 2017-07-06 DIAGNOSIS — K219 Gastro-esophageal reflux disease without esophagitis: Secondary | ICD-10-CM | POA: Diagnosis not present

## 2017-07-06 DIAGNOSIS — J309 Allergic rhinitis, unspecified: Secondary | ICD-10-CM | POA: Diagnosis not present

## 2017-07-06 DIAGNOSIS — R05 Cough: Secondary | ICD-10-CM | POA: Diagnosis not present

## 2017-07-06 DIAGNOSIS — Z6824 Body mass index (BMI) 24.0-24.9, adult: Secondary | ICD-10-CM | POA: Diagnosis not present

## 2017-07-06 DIAGNOSIS — J302 Other seasonal allergic rhinitis: Secondary | ICD-10-CM | POA: Diagnosis not present

## 2017-08-01 ENCOUNTER — Ambulatory Visit (INDEPENDENT_AMBULATORY_CARE_PROVIDER_SITE_OTHER): Payer: PPO | Admitting: Certified Nurse Midwife

## 2017-08-01 ENCOUNTER — Other Ambulatory Visit (HOSPITAL_COMMUNITY)
Admission: RE | Admit: 2017-08-01 | Discharge: 2017-08-01 | Disposition: A | Discharge status: Home / Self Care | Payer: PPO | Source: Ambulatory Visit | Attending: Obstetrics & Gynecology | Admitting: Obstetrics & Gynecology

## 2017-08-01 ENCOUNTER — Other Ambulatory Visit: Payer: Self-pay

## 2017-08-01 ENCOUNTER — Encounter: Payer: Self-pay | Admitting: Certified Nurse Midwife

## 2017-08-01 VITALS — BP 124/78 | HR 68 | Resp 16 | Ht 65.0 in | Wt 142.0 lb

## 2017-08-01 DIAGNOSIS — Z01419 Encounter for gynecological examination (general) (routine) without abnormal findings: Secondary | ICD-10-CM

## 2017-08-01 DIAGNOSIS — Z124 Encounter for screening for malignant neoplasm of cervix: Secondary | ICD-10-CM | POA: Diagnosis not present

## 2017-08-01 DIAGNOSIS — N951 Menopausal and female climacteric states: Secondary | ICD-10-CM

## 2017-08-01 DIAGNOSIS — Z853 Personal history of malignant neoplasm of breast: Secondary | ICD-10-CM | POA: Diagnosis not present

## 2017-08-01 NOTE — Progress Notes (Signed)
67 y.o. G3P1001 Married  Caucasian Fe here for annual exam. Menopausal no HRT. Denies vaginal bleeding or vaginal dryness. Still having night urination with no change. Sees Dr. Ardeth Perfect for cholesterol, and Thyroid management, labs and medication management. Continues follow up for breast cancer as indicated.  History of Osteopenia, aware BMD due. Continues with Breast Scan with [place] only, for breast screening due to pain with mammogram and will not do mammogram again.. Still has edema in area of lumpectomy at times and feel this is best for her. Eating well and exercising to help with bone health also. No  Health issues today.  No LMP recorded. Patient is postmenopausal.          Sexually active: Yes.    The current method of family planning is post menopausal status.    Exercising: Yes.  5-7 times a week   Smoker:  no  Health Maintenance: Pap:  07-21-15 neg, 07-26-16 neg History of Abnormal Pap: yes MMG: u/s 11-01-16 neg  Self Breast exams: yes Colonoscopy:  2017  BMD:   2015 osteopenia TDaP:  2017 Shingles: no Pneumonia: no Hep C and HIV: not done Labs: if needed   reports that she has never smoked. She has never used smokeless tobacco. She reports that she does not drink alcohol or use drugs.  Past Medical History:  Diagnosis Date  . Adenomatous colon polyp   . Aortic regurgitation 04/09/2017  . Breast cancer ([place])    Right breast-Invasive ductal  . Carotid stenosis 04/09/2017  . Diverticulosis   . Ductal carcinoma of breast, stage 1 ([place]) 2010  . Dyslipidemia   . Essential hypertension 04/09/2017  . Fatty liver 05/05/2011   Ultrasound   . GERD (gastroesophageal reflux disease)   . Hiatal hernia   . Hx of radiation therapy 12/02/08- 12/08/08   right breast mammosite, 34 Gy 10 fractions, given twice daily  . Hyperlipidemia 04/09/2017  . Mitral and aortic regurgitation   . MVP (mitral valve prolapse)    No antibiotics required for procedures  . Osteopenia   . Thyroid disease      Past Surgical History:  Procedure Laterality Date  . BREAST BIOPSY  2015  . BREAST SURGERY  2010   Lumpectomy; Right- Mammocyte  . PELVIC LAPAROSCOPY      Current Outpatient Medications  Medication Sig Dispense Refill  . Ascorbic Acid (VITAMIN C PO) Take by mouth.      Marland Kitchen b complex vitamins tablet Take 1 tablet by mouth daily.    . Cholecalciferol (VITAMIN D PO) Take 5,000 Units by mouth daily.     . Coenzyme Q10 (CO Q-10) 100 MG CAPS Take 1 capsule by mouth daily.    . Cyanocobalamin (VITAMIN B-12) 1000 MCG SUBL Place 1 tablet under the tongue daily.    . fluticasone (FLONASE) 50 MCG/ACT nasal spray as needed  3  . liothyronine (CYTOMEL) 5 MCG tablet Take 5 mcg by mouth daily.     . Magnesium 400 MG TABS Take 1 tablet by mouth 2 (two) times daily.    . Misc Natural Products (TURMERIC CURCUMIN) CAPS Take 1 capsule by mouth daily.    . Multiple Vitamin (MULTIVITAMIN WITH MINERALS) TABS tablet Take 1 tablet by mouth daily.    Marland Kitchen NALTREXONE HCL PO Take 4.5 mg by mouth daily.     . Omega-3 Fatty Acids (FISH OIL) 1200 MG CAPS Take 1 capsule by mouth daily.    Marland Kitchen UNABLE TO FIND at bedtime. Liver support  No current facility-administered medications for this visit.     Family History  Problem Relation Age of Onset  . Diabetes Mother        Type 1  . Hypertension Mother   . Heart disease Mother   . Arthritis Mother   . CAD Mother   . Gallstones Mother   . Stroke Mother   . Thyroid disease Mother   . Valvular heart disease Mother   . Osteoporosis Sister   . COPD Sister   . Alcohol abuse Father   . Osteopenia Sister   . Breast cancer Maternal Grandmother   . Ulcerative colitis Daughter   . Heart disease Maternal Grandfather   . Heart attack Maternal Grandfather   . Kidney cancer Paternal Grandfather   . Colon cancer Neg Hx     ROS:  Pertinent items are noted in HPI.  Otherwise, a comprehensive ROS was negative.  Exam:   BP 124/78   Pulse 68   Resp 16   Ht 5\' 5"   (1.651 m)   Wt 142 lb (64.4 kg)   BMI 23.63 kg/m  Height: 5\' 5"  (165.1 cm) Ht Readings from Last 3 Encounters:  08/01/17 5\' 5"  (1.651 m)  06/14/17 5' 5.5" (1.664 m)  04/05/17 5' 5.5" (1.664 m)    General appearance: alert, cooperative and appears stated age Head: Normocephalic, without obvious abnormality, atraumatic Neck: no adenopathy, supple, symmetrical, trachea midline and thyroid normal to inspection and palpation Lungs: clear to auscultation bilaterally Breasts: normal appearance, no masses or tenderness, No nipple retraction or dimpling, No nipple discharge or bleeding, No axillary or supraclavicular adenopathy, surgical scarring on right breast Heart: regular rate and rhythm Abdomen: soft, non-tender; no masses,  no organomegaly Extremities: extremities normal, atraumatic, no cyanosis or edema Skin: Skin color, texture, turgor normal. No rashes or lesions Lymph nodes: Cervical, supraclavicular, and axillary nodes normal. No abnormal inguinal nodes palpated Neurologic: Grossly normal   Pelvic: External genitalia:  no lesions              Urethra:  normal appearing urethra with no masses, tenderness or lesions              Bartholin's and Skene's: normal                 Vagina: normal appearing vagina with normal color and discharge, no lesions              Cervix: no bleeding following Pap, no cervical motion tenderness and no lesions              Pap taken: Yes.   Bimanual Exam:  Uterus:  normal size, contour, position, consistency, mobility, non-tender and anteverted              Adnexa: normal adnexa and no mass, fullness, tenderness               Rectovaginal: Confirms               Anus:  normal sphincter tone, no lesions  Chaperone present: yes  A:  Well Woman with normal exam  Menopausal no HRT  History of Ductal cancer on right breast has breast US not mammogram  Hypertension, cholesterol, Osteopenia, Hypothyroid with PCP management  BMD due patient will  schedule with PCP  P:   Reviewed health and wellness pertinent to exam  Aware of need to advise if vaginal bleeding  Discussed coconut oil for vaginal dryness and sexual activity. Discussed instructions for  use.  Continue follow up with breast. Encouraged to do mammogram on left breast if can not on right. Declines, understands the risk and [place] is not the best screening and will continue [place].  Continue follow up with PCP as indicated.  Stressed importance of BMD.  Pap smear: yes  counseled on breast self exam, mammography screening, feminine hygiene, adequate intake of calcium and vitamin D, diet and exercise  return annually or prn  An After Visit Summary was printed and given to the patient.

## 2017-08-01 NOTE — Patient Instructions (Signed)

## 2017-08-03 LAB — CYTOLOGY - PAP: DIAGNOSIS: NEGATIVE

## 2017-08-22 DIAGNOSIS — D692 Other nonthrombocytopenic purpura: Secondary | ICD-10-CM | POA: Diagnosis not present

## 2017-08-22 DIAGNOSIS — L812 Freckles: Secondary | ICD-10-CM | POA: Diagnosis not present

## 2017-10-17 DIAGNOSIS — E7849 Other hyperlipidemia: Secondary | ICD-10-CM | POA: Diagnosis not present

## 2017-10-17 DIAGNOSIS — E559 Vitamin D deficiency, unspecified: Secondary | ICD-10-CM | POA: Diagnosis not present

## 2017-10-17 DIAGNOSIS — E038 Other specified hypothyroidism: Secondary | ICD-10-CM | POA: Diagnosis not present

## 2017-10-17 DIAGNOSIS — R82998 Other abnormal findings in urine: Secondary | ICD-10-CM | POA: Diagnosis not present

## 2017-10-24 DIAGNOSIS — Z6822 Body mass index (BMI) 22.0-22.9, adult: Secondary | ICD-10-CM | POA: Diagnosis not present

## 2017-10-24 DIAGNOSIS — G43909 Migraine, unspecified, not intractable, without status migrainosus: Secondary | ICD-10-CM | POA: Diagnosis not present

## 2017-10-24 DIAGNOSIS — Z23 Encounter for immunization: Secondary | ICD-10-CM | POA: Diagnosis not present

## 2017-10-24 DIAGNOSIS — R05 Cough: Secondary | ICD-10-CM | POA: Diagnosis not present

## 2017-10-24 DIAGNOSIS — Z13820 Encounter for screening for osteoporosis: Secondary | ICD-10-CM | POA: Diagnosis not present

## 2017-10-24 DIAGNOSIS — Z1389 Encounter for screening for other disorder: Secondary | ICD-10-CM | POA: Diagnosis not present

## 2017-10-24 DIAGNOSIS — I6529 Occlusion and stenosis of unspecified carotid artery: Secondary | ICD-10-CM | POA: Diagnosis not present

## 2017-10-24 DIAGNOSIS — E7849 Other hyperlipidemia: Secondary | ICD-10-CM | POA: Diagnosis not present

## 2017-10-24 DIAGNOSIS — C50919 Malignant neoplasm of unspecified site of unspecified female breast: Secondary | ICD-10-CM | POA: Diagnosis not present

## 2017-10-24 DIAGNOSIS — Z0000 Encounter for general adult medical examination without abnormal findings: Secondary | ICD-10-CM | POA: Diagnosis not present

## 2017-10-24 DIAGNOSIS — E038 Other specified hypothyroidism: Secondary | ICD-10-CM | POA: Diagnosis not present

## 2017-10-24 DIAGNOSIS — I7090 Unspecified atherosclerosis: Secondary | ICD-10-CM | POA: Diagnosis not present

## 2017-10-24 DIAGNOSIS — E559 Vitamin D deficiency, unspecified: Secondary | ICD-10-CM | POA: Diagnosis not present

## 2017-10-24 DIAGNOSIS — K219 Gastro-esophageal reflux disease without esophagitis: Secondary | ICD-10-CM | POA: Diagnosis not present

## 2018-05-25 DIAGNOSIS — L570 Actinic keratosis: Secondary | ICD-10-CM | POA: Diagnosis not present

## 2018-05-25 DIAGNOSIS — D485 Neoplasm of uncertain behavior of skin: Secondary | ICD-10-CM | POA: Diagnosis not present

## 2018-05-25 DIAGNOSIS — L820 Inflamed seborrheic keratosis: Secondary | ICD-10-CM | POA: Diagnosis not present

## 2018-06-01 DIAGNOSIS — E038 Other specified hypothyroidism: Secondary | ICD-10-CM | POA: Diagnosis not present

## 2018-06-01 DIAGNOSIS — E7849 Other hyperlipidemia: Secondary | ICD-10-CM | POA: Diagnosis not present

## 2018-06-15 ENCOUNTER — Ambulatory Visit (HOSPITAL_COMMUNITY)
Admission: RE | Admit: 2018-06-15 | Discharge: 2018-06-15 | Disposition: A | Discharge status: Home / Self Care | Payer: PPO | Source: Ambulatory Visit | Attending: Cardiology | Admitting: Cardiology

## 2018-06-15 DIAGNOSIS — I6529 Occlusion and stenosis of unspecified carotid artery: Secondary | ICD-10-CM | POA: Insufficient documentation

## 2018-08-07 ENCOUNTER — Ambulatory Visit: Payer: PPO | Admitting: Certified Nurse Midwife

## 2018-10-08 ENCOUNTER — Other Ambulatory Visit: Payer: Self-pay

## 2018-10-08 ENCOUNTER — Other Ambulatory Visit (HOSPITAL_COMMUNITY)
Admission: RE | Admit: 2018-10-08 | Discharge: 2018-10-08 | Disposition: A | Discharge status: Home / Self Care | Payer: PPO | Source: Ambulatory Visit | Attending: Obstetrics & Gynecology | Admitting: Obstetrics & Gynecology

## 2018-10-08 ENCOUNTER — Encounter: Payer: Self-pay | Admitting: Certified Nurse Midwife

## 2018-10-08 ENCOUNTER — Ambulatory Visit (INDEPENDENT_AMBULATORY_CARE_PROVIDER_SITE_OTHER): Payer: PPO | Admitting: Certified Nurse Midwife

## 2018-10-08 VITALS — BP 130/70 | HR 68 | Temp 97.9°F | Resp 16 | Ht 64.75 in | Wt 144.0 lb

## 2018-10-08 DIAGNOSIS — Z1151 Encounter for screening for human papillomavirus (HPV): Secondary | ICD-10-CM | POA: Insufficient documentation

## 2018-10-08 DIAGNOSIS — Z780 Asymptomatic menopausal state: Secondary | ICD-10-CM | POA: Diagnosis not present

## 2018-10-08 DIAGNOSIS — Z124 Encounter for screening for malignant neoplasm of cervix: Secondary | ICD-10-CM | POA: Diagnosis not present

## 2018-10-08 DIAGNOSIS — Z853 Personal history of malignant neoplasm of breast: Secondary | ICD-10-CM

## 2018-10-08 DIAGNOSIS — Z8742 Personal history of other diseases of the female genital tract: Secondary | ICD-10-CM | POA: Diagnosis not present

## 2018-10-08 DIAGNOSIS — Z01419 Encounter for gynecological examination (general) (routine) without abnormal findings: Secondary | ICD-10-CM

## 2018-10-08 NOTE — Progress Notes (Signed)
68 y.o. G56P1001 Married  Caucasian Fe here for annual exam. Sees PCP for aex, Thyroid medication, hypertension, osteopenia, cholesterol. Continues do breast US screening, not mammogram ( copy of results) due to discomfort.Aware that this is not considered complete screening.. Concerned that her thyroid medication may need adjustment. Will check at next PCP visit which is soon. No other health issues today. Emotionally doing well with health care changes.  No LMP recorded. Patient is postmenopausal.          Sexually active: Yes.    The current method of family planning is post menopausal status.    Exercising: Yes.    rebounder with trampoline Smoker:  no  Review of Systems  Constitutional: Negative.   HENT: Negative.   Eyes: Negative.   Respiratory: Negative.   Cardiovascular: Negative.   Gastrointestinal: Negative.   Genitourinary: Negative.   Musculoskeletal: Negative.   Skin: Negative.   Neurological: Negative.   Endo/Heme/Allergies: Negative.   Psychiatric/Behavioral: Negative.     Health Maintenance: Pap:  07-26-16 neg,08-01-17 neg History of Abnormal Pap: yes MMG:  09-24-2015 neg, bilateral u/s 2019 neg Self Breast exams: yes Colonoscopy:  2017 BMD:   2019 with pcp TDaP:  2017 Shingles: no Pneumonia: had done Hep C and HIV: not done Labs: if needed   reports that she has never smoked. She has never used smokeless tobacco. She reports that she does not drink alcohol or use drugs.  Past Medical History:  Diagnosis Date  . Abnormal Pap smear of cervix    many yrs ago  . Adenomatous colon polyp   . Aortic regurgitation 04/09/2017  . Breast cancer ([place])    Right breast-Invasive ductal  . Carotid stenosis 04/09/2017  . Diverticulosis   . Ductal carcinoma of breast, stage 1 ([place]) 2010  . Dyslipidemia   . Essential hypertension 04/09/2017  . Fatty liver 05/05/2011   Ultrasound   . GERD (gastroesophageal reflux disease)   . Hiatal hernia   . Hx of radiation therapy  12/02/08- 12/08/08   right breast mammosite, 34 Gy 10 fractions, given twice daily  . Hyperlipidemia 04/09/2017  . Mitral and aortic regurgitation   . MVP (mitral valve prolapse)    No antibiotics required for procedures  . Osteopenia   . Thyroid disease     Past Surgical History:  Procedure Laterality Date  . BREAST BIOPSY  2015  . BREAST SURGERY  2010   Lumpectomy; Right- Mammocyte  . CRYOTHERAPY     abnormal pap smear  . PELVIC LAPAROSCOPY      Current Outpatient Medications  Medication Sig Dispense Refill  . Ascorbic Acid (VITAMIN C PO) Take by mouth.      Marland Kitchen b complex vitamins tablet Take 1 tablet by mouth daily.    . Cholecalciferol (VITAMIN D PO) Take 5,000 Units by mouth daily.     . Coenzyme Q10 (CO Q-10) 100 MG CAPS Take 1 capsule by mouth daily.    . Cyanocobalamin (VITAMIN B-12) 1000 MCG SUBL Place 1 tablet under the tongue daily.    . fluticasone (FLONASE) 50 MCG/ACT nasal spray as needed  3  . liothyronine (CYTOMEL) 5 MCG tablet Take 5 mcg by mouth daily.     . Magnesium 400 MG TABS Take 1 tablet by mouth 2 (two) times daily.    . Misc Natural Products (TURMERIC CURCUMIN) CAPS Take 1 capsule by mouth daily.    . Multiple Vitamin (MULTIVITAMIN WITH MINERALS) TABS tablet Take 1 tablet by mouth daily.    Marland Kitchen  NALTREXONE HCL PO Take 4.5 mg by mouth daily.     . Omega-3 Fatty Acids (FISH OIL) 1200 MG CAPS Take 1 capsule by mouth daily.    Marland Kitchen UNABLE TO FIND at bedtime. Liver support     No current facility-administered medications for this visit.     Family History  Problem Relation Age of Onset  . Diabetes Mother        Type 1  . Hypertension Mother   . Heart disease Mother   . Arthritis Mother   . CAD Mother   . Gallstones Mother   . Stroke Mother   . Thyroid disease Mother   . Valvular heart disease Mother   . Osteoporosis Sister   . COPD Sister   . Alcohol abuse Father   . Osteopenia Sister   . Breast cancer Maternal Grandmother   . Ulcerative colitis  Daughter   . Heart disease Maternal Grandfather   . Heart attack Maternal Grandfather   . Kidney cancer Paternal Grandfather   . Colon cancer Neg Hx     ROS:  Pertinent items are noted in HPI.  Otherwise, a comprehensive ROS was negative.  Exam:   There were no vitals taken for this visit.   Ht Readings from Last 3 Encounters:  08/01/17 5\' 5"  (1.651 m)  06/14/17 5' 5.5" (1.664 m)  04/05/17 5' 5.5" (1.664 m)    General appearance: alert, cooperative and appears stated age Head: Normocephalic, without obvious abnormality, atraumatic Neck: no adenopathy, supple, symmetrical, trachea midline and thyroid normal to inspection and palpation Lungs: clear to auscultation bilaterally Breasts: normal appearance, no masses or tenderness, No nipple retraction or dimpling, No nipple discharge or bleeding, No axillary or supraclavicular adenopathy, scarring from breast surgery on right breast Heart: regular rate and rhythm Abdomen: soft, non-tender; no masses,  no organomegaly Extremities: extremities normal, atraumatic, no cyanosis or edema Skin: Skin color, texture, turgor normal. No rashes or lesions Lymph nodes: Cervical, supraclavicular, and axillary nodes normal. No abnormal inguinal nodes palpated Neurologic: Grossly normal   Pelvic: External genitalia:  no lesions,normal female              Urethra:  normal appearing urethra with no masses, tenderness or lesions              Bartholin's and Skene's: normal                 Vagina: normal appearing vagina with normal color and discharge, no lesions              Cervix: no cervical motion tenderness, no lesions and scant blood with pap smear only              Pap taken: Yes.   Bimanual Exam:  Uterus:  normal size, contour, position, consistency, mobility, non-tender and anteverted              Adnexa: normal adnexa and no mass, fullness, tenderness               Rectovaginal: Confirms               Anus:  normal sphincter tone, no  lesions  Chaperone present: yes  A:  Well Woman with normal exam  Post menopausal no HRT  History of Breast cancer Invasive ductal on right  History of abnormal pap smear, with cryo  Cholesterol, hypertension,thyroid management with PCP.  P:   Reviewed health and wellness pertinent to exam  Aware of need  to advise if vaginal bleeding, discussed coconut oil or Olive oil use for occasional dryness. Questions addressed.  Discussed importance of mammogram and [place] screening and SBE. Patient aware.  Continue follow up with PCP as indicated  Pap smear: yes   counseled on breast self exam, mammography screening, feminine hygiene, menopause, adequate intake of calcium and vitamin D, diet and exercise, Kegel's exercises  return annually or prn  An After Visit Summary was printed and given to the patient.

## 2018-10-10 LAB — CYTOLOGY - PAP
Diagnosis: NEGATIVE
HPV: NOT DETECTED

## 2018-11-19 DIAGNOSIS — E559 Vitamin D deficiency, unspecified: Secondary | ICD-10-CM | POA: Diagnosis not present

## 2018-11-19 DIAGNOSIS — E7849 Other hyperlipidemia: Secondary | ICD-10-CM | POA: Diagnosis not present

## 2018-11-19 DIAGNOSIS — E038 Other specified hypothyroidism: Secondary | ICD-10-CM | POA: Diagnosis not present

## 2018-11-21 DIAGNOSIS — R82998 Other abnormal findings in urine: Secondary | ICD-10-CM | POA: Diagnosis not present

## 2018-11-26 DIAGNOSIS — F419 Anxiety disorder, unspecified: Secondary | ICD-10-CM | POA: Diagnosis not present

## 2018-11-26 DIAGNOSIS — C50919 Malignant neoplasm of unspecified site of unspecified female breast: Secondary | ICD-10-CM | POA: Diagnosis not present

## 2018-11-26 DIAGNOSIS — Z0000 Encounter for general adult medical examination without abnormal findings: Secondary | ICD-10-CM | POA: Diagnosis not present

## 2018-11-26 DIAGNOSIS — I7090 Unspecified atherosclerosis: Secondary | ICD-10-CM | POA: Diagnosis not present

## 2018-11-26 DIAGNOSIS — I6529 Occlusion and stenosis of unspecified carotid artery: Secondary | ICD-10-CM | POA: Diagnosis not present

## 2018-11-26 DIAGNOSIS — E038 Other specified hypothyroidism: Secondary | ICD-10-CM | POA: Diagnosis not present

## 2018-11-26 DIAGNOSIS — E785 Hyperlipidemia, unspecified: Secondary | ICD-10-CM | POA: Diagnosis not present

## 2018-11-26 DIAGNOSIS — E559 Vitamin D deficiency, unspecified: Secondary | ICD-10-CM | POA: Diagnosis not present

## 2018-11-26 DIAGNOSIS — J302 Other seasonal allergic rhinitis: Secondary | ICD-10-CM | POA: Diagnosis not present

## 2018-11-26 DIAGNOSIS — Z1331 Encounter for screening for depression: Secondary | ICD-10-CM | POA: Diagnosis not present

## 2018-11-26 DIAGNOSIS — K219 Gastro-esophageal reflux disease without esophagitis: Secondary | ICD-10-CM | POA: Diagnosis not present

## 2018-11-26 DIAGNOSIS — E039 Hypothyroidism, unspecified: Secondary | ICD-10-CM | POA: Diagnosis not present

## 2019-01-01 ENCOUNTER — Telehealth: Payer: Self-pay | Admitting: Certified Nurse Midwife

## 2019-01-01 DIAGNOSIS — Z853 Personal history of malignant neoplasm of breast: Secondary | ICD-10-CM

## 2019-01-01 DIAGNOSIS — R928 Other abnormal and inconclusive findings on diagnostic imaging of breast: Secondary | ICD-10-CM

## 2019-01-01 NOTE — Telephone Encounter (Signed)
Copy of breast US report printed from MyChart attachment and to Melvia Heaps, CNM to review.   Melvia Heaps, CNM -please review report and advise on imaging.

## 2019-01-01 NOTE — Telephone Encounter (Signed)
Patient calling to discuss breast ultrasound results. States they found something during the ultrasound and she needs to have a mammogram ordered. Hx of breast cancer.

## 2019-01-01 NOTE — Telephone Encounter (Signed)
Spoke with patient. Has been doing bilateral breast US for 6 years, unable to tolerate MMG. Hx of right breast cancer 2018. Completed breast US on 12/18/18 through [facility], was advised additional imaging of right breast is needed. Patient reports swelling at scar on right breast, has been present before, "always there", denies any other symptoms. Patient states she just left [facility], she dropped off a copy of the ultrasound report and copy of disk.   Advised patient I will f/u with [facility] and Melvia Heaps, CNM  and return call, patient will upload copy of report and send to Melvia Heaps, CNM via [employer].

## 2019-01-01 NOTE — Telephone Encounter (Signed)
I think we can just schedule her for diagnostic mammogram and [place] if needed due to the information in the report. If we bring her in this may not be palpable. Copy of report should go with the order?

## 2019-01-01 NOTE — Telephone Encounter (Signed)
See telephone encounter dated 01/01/19.

## 2019-01-02 NOTE — Telephone Encounter (Signed)
Order placed for bilateral Dx MMG and right breast US, if needed, at [facility].   Spoke with Lilia Pro at [facility], scheduled for bilateral Dx MMG and right breast US, if needed, on 01/08/19, arrive at 7:30am for 7:50am appt. Confirmed [facility] has received copy of 12/18/18 imaging report and images from [facility] provided by patient.    Spoke with patient. Advised of appt as seen above. Patient verbalizes understanding and is agreeable to date and time.   Routing to provider for final review. Patient is agreeable to disposition. Will close encounter.

## 2019-01-08 ENCOUNTER — Other Ambulatory Visit: Payer: Self-pay

## 2019-01-08 ENCOUNTER — Ambulatory Visit
Admission: RE | Admit: 2019-01-08 | Discharge: 2019-01-08 | Disposition: A | Discharge status: Home / Self Care | Payer: PPO | Source: Ambulatory Visit | Attending: Certified Nurse Midwife | Admitting: Certified Nurse Midwife

## 2019-01-08 DIAGNOSIS — R922 Inconclusive mammogram: Secondary | ICD-10-CM | POA: Diagnosis not present

## 2019-01-08 DIAGNOSIS — Z853 Personal history of malignant neoplasm of breast: Secondary | ICD-10-CM

## 2019-01-08 DIAGNOSIS — N6489 Other specified disorders of breast: Secondary | ICD-10-CM | POA: Diagnosis not present

## 2019-01-08 DIAGNOSIS — R928 Other abnormal and inconclusive findings on diagnostic imaging of breast: Secondary | ICD-10-CM

## 2019-01-08 HISTORY — DX: Personal history of irradiation: Z92.3

## 2019-05-29 DIAGNOSIS — C50919 Malignant neoplasm of unspecified site of unspecified female breast: Secondary | ICD-10-CM | POA: Diagnosis not present

## 2019-05-29 DIAGNOSIS — N390 Urinary tract infection, site not specified: Secondary | ICD-10-CM | POA: Diagnosis not present

## 2019-05-29 DIAGNOSIS — R358 Other polyuria: Secondary | ICD-10-CM | POA: Diagnosis not present

## 2019-05-29 DIAGNOSIS — E039 Hypothyroidism, unspecified: Secondary | ICD-10-CM | POA: Diagnosis not present

## 2019-05-29 DIAGNOSIS — I6529 Occlusion and stenosis of unspecified carotid artery: Secondary | ICD-10-CM | POA: Diagnosis not present

## 2019-05-29 DIAGNOSIS — E559 Vitamin D deficiency, unspecified: Secondary | ICD-10-CM | POA: Diagnosis not present

## 2019-05-29 DIAGNOSIS — M549 Dorsalgia, unspecified: Secondary | ICD-10-CM | POA: Diagnosis not present

## 2019-06-02 ENCOUNTER — Ambulatory Visit: Payer: PPO | Attending: Internal Medicine

## 2019-06-02 ENCOUNTER — Ambulatory Visit: Payer: PPO

## 2019-06-02 DIAGNOSIS — Z23 Encounter for immunization: Secondary | ICD-10-CM

## 2019-06-02 NOTE — Progress Notes (Signed)
   Covid-19 Vaccination Clinic  Name:  Brooke Wilkinson    MRN: [id number] DOB: [birth year]  06/02/2019  Brooke Wilkinson was observed post Covid-19 immunization for 15 minutes without incidence. She was provided with Vaccine Information Sheet and instruction to access the V-Safe system.   Brooke Wilkinson was instructed to call 911 with any severe reactions post vaccine: Marland Kitchen Difficulty breathing  . Swelling of your face and throat  . A fast heartbeat  . A bad rash all over your body  . Dizziness and weakness    Immunizations Administered    Name Date Dose VIS Date Route   Pfizer COVID-19 Vaccine 06/02/2019  1:28 PM 0.3 mL 03/29/2019 Intramuscular   Manufacturer: [place]   Lot: [id number]   [place]: [id number]

## 2019-06-25 ENCOUNTER — Ambulatory Visit: Payer: PPO | Attending: Internal Medicine

## 2019-06-25 DIAGNOSIS — Z23 Encounter for immunization: Secondary | ICD-10-CM

## 2019-06-25 NOTE — Progress Notes (Signed)
   Covid-19 Vaccination Clinic  Name:  Brooke Wilkinson    MRN: [id number] DOB: [birth year]  06/25/2019  [name] was observed post Covid-19 immunization for 15 minutes without incident. She was provided with Vaccine Information Sheet and instruction to access the V-Safe system.   [name] was instructed to call 911 with any severe reactions post vaccine: Marland Kitchen Difficulty breathing  . Swelling of face and throat  . A fast heartbeat  . A bad rash all over body  . Dizziness and weakness   Immunizations Administered    Name Date Dose VIS Date Route   Pfizer COVID-19 Vaccine 06/25/2019  8:10 AM 0.3 mL 03/29/2019 Intramuscular   Manufacturer: [place]   Lot: [id number]   [place]: [id number]

## 2019-06-26 ENCOUNTER — Ambulatory Visit: Payer: PPO

## 2019-07-09 ENCOUNTER — Encounter: Payer: Self-pay | Admitting: Certified Nurse Midwife

## 2019-10-25 NOTE — Progress Notes (Signed)
69 y.o. G1P1001 Married White or Caucasian Not Hispanic or Latino female here for annual exam.   She c/o worsening incontinence. She has some urgency to void, occasionally leaks on the way to the bathroom. She will leak with valsalva, can leak a lot. She has had a negative urine culture with her primary. She does some kegel.  She has about 16 oz of coffee a day. Drinks a lot of water.  No vaginal bleeding. Some vaginal dryness with intercourse, helped with lubrication.  H/O Breast cancer     No LMP recorded. Patient is postmenopausal.          Sexually active: Yes.    The current method of family planning is post menopausal status.    Exercising: No.  The patient does not participate in regular exercise at present. Smoker:  no  Health Maintenance: Pap:10/08/18 Neg HPV neg   08-01-17 neg,  07-26-16 neg History of abnormal Pap:  Yes treated with cryosurgery late 20's.  MMG:  01/08/19 density C Bi-rads 2 benign  BMD:   2019 with PCP Colonoscopy: 02/23/2016 normal f/u 5 years TDaP:  07/21/15 Gardasil: NA   reports that she has never smoked. She has never used smokeless tobacco. She reports that she does not drink alcohol and does not use drugs. She works for a [employer], fast food, does paperwork and billing. She wants to work. 2 daughters (66 and 58), 36 year old grandson. First daughter adopted.   Past Medical History:  Diagnosis Date  . Abnormal Pap smear of cervix    many yrs ago  . Adenomatous colon polyp   . Aortic regurgitation 04/09/2017  . Breast cancer ([place])    Right breast-Invasive ductal  . Carotid stenosis 04/09/2017  . Diverticulosis   . Ductal carcinoma of breast, stage 1 ([place]) 2010  . Dyslipidemia   . Essential hypertension 04/09/2017  . Fatty liver 05/05/2011   Ultrasound   . GERD (gastroesophageal reflux disease)   . Hiatal hernia   . Hx of radiation therapy 12/02/08- 12/08/08   right breast mammosite, 34 Gy 10 fractions, given twice daily  . Hyperlipidemia 04/09/2017  .  Mitral and aortic regurgitation   . MVP (mitral valve prolapse)    No antibiotics required for procedures  . Osteopenia   . Personal history of radiation therapy   . Thyroid disease     Past Surgical History:  Procedure Laterality Date  . BREAST BIOPSY  2015  . BREAST LUMPECTOMY Right 2010  . BREAST SURGERY  2010   Lumpectomy; Right- Mammocyte  . CRYOTHERAPY     abnormal pap smear  . PELVIC LAPAROSCOPY      Current Outpatient Medications  Medication Sig Dispense Refill  . Ascorbic Acid (VITAMIN C PO) Take by mouth.      Marland Kitchen b complex vitamins tablet Take 1 tablet by mouth daily.    . Calcium Carb-Cholecalciferol (CALCIUM 1000 + D) 1000-800 MG-UNIT TABS     . Coenzyme Q10 (CO Q-10) 100 MG CAPS Take 1 capsule by mouth daily.    . Cyanocobalamin (VITAMIN B-12) 1000 MCG SUBL Place 1 tablet under the tongue daily.    Marland Kitchen liothyronine (CYTOMEL) 5 MCG tablet Take 5 mcg by mouth daily.     . Magnesium 400 MG TABS Take 1 tablet by mouth 2 (two) times daily.    . Misc Natural Products (TURMERIC CURCUMIN) CAPS Take 1 capsule by mouth daily.    . Multiple Vitamin (MULTIVITAMIN WITH MINERALS) TABS tablet Take 1 tablet  by mouth daily.    Marland Kitchen NALTREXONE HCL PO Take 4.5 mg by mouth daily.     . Policosanol 10 MG CAPS 4 days a week    . Red Yeast Rice 600 MG CAPS 3 days a week    . UNABLE TO FIND Vitamin a,c, e, + selenium    . UNABLE TO FIND Mullein leaf 500mg      No current facility-administered medications for this visit.    Family History  Problem Relation Age of Onset  . Diabetes Mother        Type 1  . Hypertension Mother   . Heart disease Mother   . Arthritis Mother   . CAD Mother   . Gallstones Mother   . Stroke Mother   . Thyroid disease Mother   . Valvular heart disease Mother   . Osteoporosis Sister   . COPD Sister   . Alcohol abuse Father   . Osteopenia Sister   . Breast cancer Maternal Grandmother   . Ulcerative colitis Daughter   . Heart disease Maternal Grandfather    . Heart attack Maternal Grandfather   . Kidney cancer Paternal Grandfather   . Colon cancer Neg Hx     Review of Systems  All other systems reviewed and are negative.   Exam:   There were no vitals taken for this visit.  Weight change: @WEIGHTCHANGE @ Height:      Ht Readings from Last 3 Encounters:  10/08/18 5' 4.75" (1.645 m)  08/01/17 5\' 5"  (1.651 m)  06/14/17 5' 5.5" (1.664 m)    General appearance: alert, cooperative and appears stated age Head: Normocephalic, without obvious abnormality, atraumatic Neck: no adenopathy, supple, symmetrical, trachea midline and thyroid normal to inspection and palpation Lungs: clear to auscultation bilaterally Cardiovascular: regular rate and rhythm Breasts: normal appearance, no masses or tenderness, evidence of right lumpectomy.  Abdomen: soft, non-tender; non distended,  no masses,  no organomegaly Extremities: extremities normal, atraumatic, no cyanosis or edema Skin: Skin color, texture, turgor normal. No rashes or lesions Lymph nodes: Cervical, supraclavicular, and axillary nodes normal. No abnormal inguinal nodes palpated Neurologic: Grossly normal   Pelvic: External genitalia:  no lesions              Urethra:  normal appearing urethra with no masses, tenderness or lesions              Bartholins and Skenes: normal                 Vagina: normal appearing vagina with normal color and discharge, no lesions              Cervix: no lesions               Bimanual Exam:  Uterus:  normal size, contour, position, consistency, mobility, non-tender              Adnexa: no mass, fullness, tenderness               Rectovaginal: Confirms               Anus:  normal sphincter tone, no lesions  Terence Lux chaperoned for the exam.  A:  Well Woman with normal exam  H/o cryosurgery in her late 20's  H/o breast cancer, some residual pain in the right breast.   Mixed incontinence, negative urine culture with her primary  P:   No pap  this year, will space to q 5 years  Mammogram due  in the fall (h/o pain on the right with mammogram, they helped her at the breast center so it's tolerable).   Colonoscopy next year  DEXA with her primary  Labs with primary  Discussed breast self exam  Discussed calcium and vit D intake  [place] information given, discussed the option of PT, medication and surgery

## 2019-10-28 ENCOUNTER — Other Ambulatory Visit: Payer: Self-pay

## 2019-10-28 ENCOUNTER — Ambulatory Visit (INDEPENDENT_AMBULATORY_CARE_PROVIDER_SITE_OTHER): Payer: PPO | Admitting: Obstetrics and Gynecology

## 2019-10-28 ENCOUNTER — Encounter: Payer: Self-pay | Admitting: Obstetrics and Gynecology

## 2019-10-28 ENCOUNTER — Ambulatory Visit: Payer: PPO | Admitting: Certified Nurse Midwife

## 2019-10-28 VITALS — BP 140/62 | HR 102 | Ht 64.75 in | Wt 142.6 lb

## 2019-10-28 DIAGNOSIS — Z01419 Encounter for gynecological examination (general) (routine) without abnormal findings: Secondary | ICD-10-CM | POA: Diagnosis not present

## 2019-10-28 DIAGNOSIS — Z853 Personal history of malignant neoplasm of breast: Secondary | ICD-10-CM | POA: Diagnosis not present

## 2019-10-28 DIAGNOSIS — N3946 Mixed incontinence: Secondary | ICD-10-CM

## 2019-10-28 NOTE — Patient Instructions (Addendum)
EXERCISE AND DIET:  We recommended that you start or continue a regular exercise program for good health. Regular exercise means any activity that makes your heart beat faster and makes you sweat.  We recommend exercising at least 30 minutes per day at least 3 days a week, preferably 4 or 5.  We also recommend a diet low in fat and sugar.  Inactivity, poor dietary choices and obesity can cause diabetes, heart attack, stroke, and kidney damage, among others.    ALCOHOL AND SMOKING:  Women should limit their alcohol intake to no more than 7 drinks/beers/glasses of wine (combined, not each!) per week. Moderation of alcohol intake to this level decreases your risk of breast cancer and liver damage. And of course, no recreational drugs are part of a healthy lifestyle.  And absolutely no smoking or even second hand smoke. Most people know smoking can cause heart and lung diseases, but did you know it also contributes to weakening of your bones? Aging of your skin?  Yellowing of your teeth and nails?  CALCIUM AND VITAMIN D:  Adequate intake of calcium and Vitamin D are recommended.  The recommendations for exact amounts of these supplements seem to change often, but generally speaking 1,200 mg of calcium (between diet and supplement) and 800 units of Vitamin D per day seems prudent. Certain women may benefit from higher intake of Vitamin D.  If you are among these women, your doctor will have told you during your visit.    PAP SMEARS:  Pap smears, to check for cervical cancer or precancers,  have traditionally been done yearly, although recent scientific advances have shown that most women can have pap smears less often.  However, every woman still should have a physical exam from her gynecologist every year. It will include a breast check, inspection of the vulva and vagina to check for abnormal growths or skin changes, a visual exam of the cervix, and then an exam to evaluate the size and shape of the uterus and  ovaries.  And after 70 years of age, a rectal exam is indicated to check for rectal cancers. We will also provide age appropriate advice regarding health maintenance, like when you should have certain vaccines, screening for sexually transmitted diseases, bone density testing, colonoscopy, mammograms, etc.   MAMMOGRAMS:  All women over 40 years old should have a yearly mammogram. Many facilities now offer a "3D" mammogram, which may cost around $50 extra out of pocket. If possible,  we recommend you accept the option to have the 3D mammogram performed.  It both reduces the number of women who will be called back for extra views which then turn out to be normal, and it is better than the routine mammogram at detecting truly abnormal areas.    COLON CANCER SCREENING: Now recommend starting at age 45. At this time colonoscopy is not covered for routine screening until 50. There are take home tests that can be done between 45-49.   COLONOSCOPY:  Colonoscopy to screen for colon cancer is recommended for all women at age 50.  We know, you hate the idea of the prep.  We agree, BUT, having colon cancer and not knowing it is worse!!  Colon cancer so often starts as a polyp that can be seen and removed at colonscopy, which can quite literally save your life!  And if your first colonoscopy is normal and you have no family history of colon cancer, most women don't have to have it again for   10 years.  Once every ten years, you can do something that may end up saving your life, right?  We will be happy to help you get it scheduled when you are ready.  Be sure to check your insurance coverage so you understand how much it will cost.  It may be covered as a preventative service at no cost, but you should check your particular policy.      Breast Self-Awareness Breast self-awareness means being familiar with how your breasts look and feel. It involves checking your breasts regularly and reporting any changes to your  health care provider. Practicing breast self-awareness is important. A change in your breasts can be a sign of a serious medical problem. Being familiar with how your breasts look and feel allows you to find any problems early, when treatment is more likely to be successful. All women should practice breast self-awareness, including women who have had breast implants. How to do a breast self-exam One way to learn what is normal for your breasts and whether your breasts are changing is to do a breast self-exam. To do a breast self-exam: Look for Changes  1. Remove all the clothing above your waist. 2. Stand in front of a mirror in a room with good lighting. 3. Put your hands on your hips. 4. Push your hands firmly downward. 5. Compare your breasts in the mirror. Look for differences between them (asymmetry), such as: ? Differences in shape. ? Differences in size. ? Puckers, dips, and bumps in one breast and not the other. 6. Look at each breast for changes in your skin, such as: ? Redness. ? Scaly areas. 7. Look for changes in your nipples, such as: ? Discharge. ? Bleeding. ? Dimpling. ? Redness. ? A change in position. Feel for Changes Carefully feel your breasts for lumps and changes. It is best to do this while lying on your back on the floor and again while sitting or standing in the shower or tub with soapy water on your skin. Feel each breast in the following way:  Place the arm on the side of the breast you are examining above your head.  Feel your breast with the other hand.  Start in the nipple area and make  inch (2 cm) overlapping circles to feel your breast. Use the pads of your three middle fingers to do this. Apply light pressure, then medium pressure, then firm pressure. The light pressure will allow you to feel the tissue closest to the skin. The medium pressure will allow you to feel the tissue that is a little deeper. The firm pressure will allow you to feel the tissue  close to the ribs.  Continue the overlapping circles, moving downward over the breast until you feel your ribs below your breast.  Move one finger-width toward the center of the body. Continue to use the  inch (2 cm) overlapping circles to feel your breast as you move slowly up toward your collarbone.  Continue the up and down exam using all three pressures until you reach your armpit.  Write Down What You Find  Write down what is normal for each breast and any changes that you find. Keep a written record with breast changes or normal findings for each breast. By writing this information down, you do not need to depend only on memory for size, tenderness, or location. Write down where you are in your menstrual cycle, if you are still menstruating. If you are having trouble noticing differences   in your breasts, do not get discouraged. With time you will become more familiar with the variations in your breasts and more comfortable with the exam. How often should I examine my breasts? Examine your breasts every month. If you are breastfeeding, the best time to examine your breasts is after a feeding or after using a breast pump. If you menstruate, the best time to examine your breasts is 5-7 days after your period is over. During your period, your breasts are lumpier, and it may be more difficult to notice changes. When should I see my health care provider? See your health care provider if you notice:  A change in shape or size of your breasts or nipples.  A change in the skin of your breast or nipples, such as a reddened or scaly area.  Unusual discharge from your nipples.  A lump or thick area that was not there before.  Pain in your breasts.  Anything that concerns you.  Kegel Exercises  Kegel exercises can help strengthen your pelvic floor muscles. The pelvic floor is a group of muscles that support your rectum, small intestine, and bladder. In females, pelvic floor muscles also help  support the womb (uterus). These muscles help you control the flow of urine and stool. Kegel exercises are painless and simple, and they do not require any equipment. Your provider may suggest Kegel exercises to:  Improve bladder and bowel control.  Improve sexual response.  Improve weak pelvic floor muscles after surgery to remove the uterus (hysterectomy) or pregnancy (females).  Improve weak pelvic floor muscles after prostate gland removal or surgery (males). Kegel exercises involve squeezing your pelvic floor muscles, which are the same muscles you squeeze when you try to stop the flow of urine or keep from passing gas. The exercises can be done while sitting, standing, or lying down, but it is best to vary your position. Exercises How to do Kegel exercises: 1. Squeeze your pelvic floor muscles tight. You should feel a tight lift in your rectal area. If you are a female, you should also feel a tightness in your vaginal area. Keep your stomach, buttocks, and legs relaxed. 2. Hold the muscles tight for up to 10 seconds. 3. Breathe normally. 4. Relax your muscles. 5. Repeat as told by your health care provider. Repeat this exercise daily as told by your health care provider. Continue to do this exercise for at least 4-6 weeks, or for as long as told by your health care provider. You may be referred to a physical therapist who can help you learn more about how to do Kegel exercises. Depending on your condition, your health care provider may recommend:  Varying how long you squeeze your muscles.  Doing several sets of exercises every day.  Doing exercises for several weeks.  Making Kegel exercises a part of your regular exercise routine. This information is not intended to replace advice given to you by your health care provider. Make sure you discuss any questions you have with your health care provider. Document Revised: 11/22/2017 Document Reviewed: 11/22/2017 [employer] Patient  Education  2020 [employer] Urinary Incontinence  Urinary incontinence refers to a condition in which a person is unable to control where and when to pass urine. A person with this condition will urinate when he or she does not mean to (involuntarily). What are the causes? This condition may be caused by:  Medicines.  Infections.  Constipation.  Overactive bladder muscles.  Weak bladder muscles.  Weak pelvic   floor muscles. These muscles provide support for the bladder, intestine, and, in women, the uterus.  Enlarged prostate in men. The prostate is a gland near the bladder. When it gets too big, it can pinch the urethra. With the urethra blocked, the bladder can weaken and lose the ability to empty properly.  Surgery.  Emotional factors, such as anxiety, stress, or post-traumatic stress disorder (PTSD).  Pelvic organ prolapse. This happens in women when organs shift out of place and into the vagina. This shift can prevent the bladder and urethra from working properly. What increases the risk? The following factors may make you more likely to develop this condition:  Older age.  Obesity and physical inactivity.  Pregnancy and childbirth.  Menopause.  Diseases that affect the nerves or spinal cord (neurological diseases).  Long-term (chronic) coughing. This can increase pressure on the bladder and pelvic floor muscles. What are the signs or symptoms? Symptoms may vary depending on the type of urinary incontinence you have. They include:  A sudden urge to urinate, but passing urine involuntarily before you can get to a bathroom (urge incontinence).  Suddenly passing urine with any activity that forces urine to pass, such as coughing, laughing, exercise, or sneezing (stress incontinence).  Needing to urinate often, but urinating only a small amount, or constantly dribbling urine (overflow incontinence).  Urinating because you cannot get to the bathroom in time due to a  physical disability, such as arthritis or injury, or communication and thinking problems, such as Alzheimer disease (functional incontinence). How is this diagnosed? This condition may be diagnosed based on:  Your medical history.  A physical exam.  Tests, such as: ? Urine tests. ? X-rays of your kidney and bladder. ? Ultrasound. ? CT scan. ? Cystoscopy. In this procedure, a health care provider inserts a tube with a light and camera (cystoscope) through the urethra and into the bladder in order to check for problems. ? Urodynamic testing. These tests assess how well the bladder, urethra, and sphincter can store and release urine. There are different types of urodynamic tests, and they vary depending on what the test is measuring. To help diagnose your condition, your health care provider may recommend that you keep a log of when you urinate and how much you urinate. How is this treated? Treatment for this condition depends on the type of incontinence that you have and its cause. Treatment may include:  Lifestyle changes, such as: ? Quitting smoking. ? Maintaining a healthy weight. ? Staying active. Try to get 150 minutes of moderate-intensity exercise every week. Ask your health care provider which activities are safe for you. ? Eating a healthy diet.  Avoid high-fat foods, like fried foods.  Avoid refined carbohydrates like white bread and white rice.  Limit how much alcohol and caffeine you drink.  Increase your fiber intake. Foods such as fresh fruits, vegetables, beans, and whole grains are healthy sources of fiber.  Pelvic floor muscle exercises.  Bladder training, such as lengthening the amount of time between bathroom breaks, or using the bathroom at regular intervals.  Using techniques to suppress bladder urges. This can include distraction techniques or controlled breathing exercises.  Medicines to relax the bladder muscles and prevent bladder spasms.  Medicines to  help slow or prevent the growth of a man's prostate.  Botox injections. These can help relax the bladder muscles.  Using pulses of electricity to help change bladder reflexes (electrical nerve stimulation).  For women, using a medical device to prevent   urine leaks. This is a small, tampon-like, disposable device that is inserted into the urethra.  Injecting collagen or carbon beads (bulking agents) into the urinary sphincter. These can help thicken tissue and close the bladder opening.  Surgery. Follow these instructions at home: Lifestyle  Limit alcohol and caffeine. These can fill your bladder quickly and irritate it.  Keep yourself clean to help prevent odors and skin damage. Ask your doctor about special skin creams and cleansers that can protect the skin from urine.  Consider wearing pads or adult diapers. Make sure to change them regularly, and always change them right after experiencing incontinence. General instructions  Take over-the-counter and prescription medicines only as told by your health care provider.  Use the bathroom about every 3-4 hours, even if you do not feel the need to urinate. Try to empty your bladder completely every time. After urinating, wait a minute. Then try to urinate again.  Make sure you are in a relaxed position while urinating.  If your incontinence is caused by nerve problems, keep a log of the medicines you take and the times you go to the bathroom.  Keep all follow-up visits as told by your health care provider. This is important. Contact a health care provider if:  You have pain that gets worse.  Your incontinence gets worse. Get help right away if:  You have a fever or chills.  You are unable to urinate.  You have redness in your groin area or down your legs. Summary  Urinary incontinence refers to a condition in which a person is unable to control where and when to pass urine.  This condition may be caused by medicines,  infection, weak bladder muscles, weak pelvic floor muscles, enlargement of the prostate (in men), or surgery.  The following factors increase your risk for developing this condition: older age, obesity, pregnancy and childbirth, menopause, neurological diseases, and chronic coughing.  There are several types of urinary incontinence. They include urge incontinence, stress incontinence, overflow incontinence, and functional incontinence.  This condition is usually treated first with lifestyle and behavioral changes, such as quitting smoking, eating a healthier diet, and doing regular pelvic floor exercises. Other treatment options include medicines, bulking agents, medical devices, electrical nerve stimulation, or surgery. This information is not intended to replace advice given to you by your health care provider. Make sure you discuss any questions you have with your health care provider. Document Revised: 04/14/2017 Document Reviewed: 07/14/2016 [employer] Patient Education  2020 [employer]  

## 2019-11-30 DIAGNOSIS — H524 Presbyopia: Secondary | ICD-10-CM | POA: Diagnosis not present

## 2019-12-06 DIAGNOSIS — E039 Hypothyroidism, unspecified: Secondary | ICD-10-CM | POA: Diagnosis not present

## 2019-12-06 DIAGNOSIS — E559 Vitamin D deficiency, unspecified: Secondary | ICD-10-CM | POA: Diagnosis not present

## 2019-12-06 DIAGNOSIS — E785 Hyperlipidemia, unspecified: Secondary | ICD-10-CM | POA: Diagnosis not present

## 2019-12-13 DIAGNOSIS — F419 Anxiety disorder, unspecified: Secondary | ICD-10-CM | POA: Diagnosis not present

## 2019-12-13 DIAGNOSIS — E039 Hypothyroidism, unspecified: Secondary | ICD-10-CM | POA: Diagnosis not present

## 2019-12-13 DIAGNOSIS — I6523 Occlusion and stenosis of bilateral carotid arteries: Secondary | ICD-10-CM | POA: Diagnosis not present

## 2019-12-13 DIAGNOSIS — Z1212 Encounter for screening for malignant neoplasm of rectum: Secondary | ICD-10-CM | POA: Diagnosis not present

## 2019-12-13 DIAGNOSIS — Z853 Personal history of malignant neoplasm of breast: Secondary | ICD-10-CM | POA: Diagnosis not present

## 2019-12-13 DIAGNOSIS — E559 Vitamin D deficiency, unspecified: Secondary | ICD-10-CM | POA: Diagnosis not present

## 2019-12-13 DIAGNOSIS — E785 Hyperlipidemia, unspecified: Secondary | ICD-10-CM | POA: Diagnosis not present

## 2019-12-13 DIAGNOSIS — I2510 Atherosclerotic heart disease of native coronary artery without angina pectoris: Secondary | ICD-10-CM | POA: Diagnosis not present

## 2019-12-13 DIAGNOSIS — R82998 Other abnormal findings in urine: Secondary | ICD-10-CM | POA: Diagnosis not present

## 2019-12-13 DIAGNOSIS — Z0000 Encounter for general adult medical examination without abnormal findings: Secondary | ICD-10-CM | POA: Diagnosis not present

## 2019-12-13 DIAGNOSIS — K219 Gastro-esophageal reflux disease without esophagitis: Secondary | ICD-10-CM | POA: Diagnosis not present

## 2020-01-31 ENCOUNTER — Other Ambulatory Visit: Payer: Self-pay | Admitting: Internal Medicine

## 2020-01-31 DIAGNOSIS — Z1231 Encounter for screening mammogram for malignant neoplasm of breast: Secondary | ICD-10-CM

## 2020-02-28 ENCOUNTER — Ambulatory Visit: Payer: PPO

## 2020-03-02 ENCOUNTER — Other Ambulatory Visit: Payer: Self-pay

## 2020-03-02 ENCOUNTER — Ambulatory Visit
Admission: RE | Admit: 2020-03-02 | Discharge: 2020-03-02 | Disposition: A | Discharge status: Home / Self Care | Payer: PPO | Source: Ambulatory Visit | Attending: Physician Assistant | Admitting: Physician Assistant

## 2020-03-02 VITALS — BP 168/84 | HR 96 | Temp 98.1°F | Resp 15

## 2020-03-02 DIAGNOSIS — J069 Acute upper respiratory infection, unspecified: Secondary | ICD-10-CM | POA: Diagnosis not present

## 2020-03-02 DIAGNOSIS — Z7689 Persons encountering health services in other specified circumstances: Secondary | ICD-10-CM | POA: Diagnosis not present

## 2020-03-02 MED ORDER — AZELASTINE HCL 0.1 % NA SOLN: 2.0000 | Freq: Two times a day (BID) | NASAL | 0 refills | Status: DC

## 2020-03-02 MED ORDER — FLUTICASONE PROPIONATE 50 MCG/ACT NA SUSP: 2.0000 | Freq: Every day | NASAL | 0 refills | Status: DC

## 2020-03-02 MED ORDER — BENZONATATE 200 MG PO CAPS: 200.0000 mg | ORAL_CAPSULE | Freq: Three times a day (TID) | ORAL | 0 refills | Status: DC

## 2020-03-02 MED ORDER — LEVOFLOXACIN 750 MG PO TABS: 750.0000 mg | ORAL_TABLET | Freq: Every day | ORAL | 0 refills | Status: DC

## 2020-03-02 MED ORDER — FLUCONAZOLE 150 MG PO TABS: 150.0000 mg | ORAL_TABLET | Freq: Every day | ORAL | 0 refills | Status: DC

## 2020-03-02 NOTE — ED Provider Notes (Signed)
[facility]    CSN: [id number] Arrival date & time: 03/02/20  1150      History   Chief Complaint Chief Complaint  Patient presents with  . Sore Throat  . Cough    HPI Brooke Wilkinson is a 69 y.o. female.   68 year old female comes in for 1 week history of URI symptoms. Sore throat, nasal congestion, nonproductive cough, body aches. States has some loss of smell/changes in taste, but more due to nasal congestion. Denies fever, shortness of breath. Positive sick contact. COVID vaccinated. otc medicine with temporary relief.      Past Medical History:  Diagnosis Date  . Abnormal Pap smear of cervix    many yrs ago  . Adenomatous colon polyp   . Aortic regurgitation 04/09/2017  . Breast cancer ([place])    Right breast-Invasive ductal  . Carotid stenosis 04/09/2017  . Diverticulosis   . Ductal carcinoma of breast, stage 1 ([place]) 2010  . Dyslipidemia   . Essential hypertension 04/09/2017  . Fatty liver 05/05/2011   Ultrasound   . GERD (gastroesophageal reflux disease)   . Hiatal hernia   . Hx of radiation therapy 12/02/08- 12/08/08   right breast mammosite, 34 Gy 10 fractions, given twice daily  . Hyperlipidemia 04/09/2017  . Mitral and aortic regurgitation   . MVP (mitral valve prolapse)    No antibiotics required for procedures  . Osteopenia   . Personal history of radiation therapy   . Thyroid disease     Patient Active Problem List   Diagnosis Date Noted  . Hyperlipidemia 04/09/2017  . Carotid stenosis 04/09/2017  . Essential hypertension 04/09/2017  . Aortic regurgitation 04/09/2017  . Hx of radiation therapy   . Hypothyroid 03/08/2011  . Osteopenia   . MVP (mitral valve prolapse)   . Cancer ([place])   . Benign colon polyp     Past Surgical History:  Procedure Laterality Date  . BREAST BIOPSY  2015  . BREAST LUMPECTOMY Right 2010  . BREAST SURGERY  2010   Lumpectomy; Right- Mammocyte  . CRYOTHERAPY     abnormal pap smear  . PELVIC  LAPAROSCOPY      OB History    Gravida  1   Para  1   Term  1   Preterm      AB      Living  1     SAB      TAB      Ectopic      Multiple      Live Births  1        Obstetric Comments  1 adopted also         Home Medications    Prior to Admission medications   Medication Sig Start Date End Date Taking? Authorizing Provider  Ascorbic Acid (VITAMIN C PO) Take by mouth.     Yes [provider]  b complex vitamins tablet Take 1 tablet by mouth daily.   Yes [provider]  Cyanocobalamin (VITAMIN B-12) 1000 MCG SUBL Place 1 tablet under the tongue daily.   Yes [provider]  liothyronine (CYTOMEL) 5 MCG tablet Take 5 mcg by mouth daily.    Yes [provider]  Multiple Vitamin (MULTIVITAMIN WITH MINERALS) TABS tablet Take 1 tablet by mouth daily.   Yes [provider]  NALTREXONE HCL PO Take 4.5 mg by mouth daily.    Yes [provider]  UNABLE TO FIND Vitamin a,c,  e, + selenium 03/26/17  Yes [provider]  UNABLE TO FIND Mullein leaf 500mg  03/20/17  Yes [provider]  azelastine (ASTELIN) 0.1 % nasal spray Place 2 sprays into both nostrils 2 (two) times daily. 03/02/20   Tasia Catchings, Talulah Schirmer V, PA-C  benzonatate (TESSALON) 200 MG capsule Take 1 capsule (200 mg total) by mouth every 8 (eight) hours. 03/02/20   Tasia Catchings, Juda Toepfer V, PA-C  Coenzyme Q10 (CO Q-10) 100 MG CAPS Take 1 capsule by mouth daily.    [provider]  fluconazole (DIFLUCAN) 150 MG tablet Take 1 tablet (150 mg total) by mouth daily. Take second dose 72 hours later if symptoms still persists. 03/06/20   Tasia Catchings, Miya Luviano V, PA-C  fluticasone (FLONASE) 50 MCG/ACT nasal spray Place 2 sprays into both nostrils daily. 03/02/20   Tasia Catchings, Ellianah Cordy V, PA-C  levofloxacin (LEVAQUIN) 750 MG tablet Take 1 tablet (750 mg total) by mouth daily. 03/06/20   Ok Edwards, PA-C  Magnesium 400 MG TABS Take 1 tablet by mouth 2 (two) times daily.    [provider]   Misc Natural Products (TURMERIC CURCUMIN) CAPS Take 1 capsule by mouth daily.    [provider]  Policosanol 10 MG CAPS 4 days a week 10/30/17   [provider]  Red Yeast Rice 600 MG CAPS 3 days a week 10/24/17   [provider]    Family History Family History  Problem Relation Age of Onset  . Diabetes Mother        Type 1  . Hypertension Mother   . Heart disease Mother   . Arthritis Mother   . CAD Mother   . Gallstones Mother   . Stroke Mother   . Thyroid disease Mother   . Valvular heart disease Mother   . Osteoporosis Sister   . COPD Sister   . Alcohol abuse Father   . Osteopenia Sister   . Breast cancer Maternal Grandmother   . Ulcerative colitis Daughter   . Heart disease Maternal Grandfather   . Heart attack Maternal Grandfather   . Kidney cancer Paternal Grandfather   . Colon cancer Neg Hx     Social History Social History   Tobacco Use  . Smoking status: Never Smoker  . Smokeless tobacco: Never Used  Substance Use Topics  . Alcohol use: No    Alcohol/week: 0.0 standard drinks  . Drug use: No     Allergies   Erythromycin, Penicillins, and Tetracycline   Review of Systems Review of Systems  Reason unable to perform ROS: See HPI as above.     Physical Exam Triage Vital Signs ED Triage Vitals  Enc Vitals Group     BP 03/02/20 1214 (!) 168/84     Pulse Rate 03/02/20 1214 96     Resp 03/02/20 1214 15     Temp 03/02/20 1214 98.1 F (36.7 C)     Temp Source 03/02/20 1214 Oral     SpO2 03/02/20 1214 96 %     Weight --      Height --      Head Circumference --      Peak Flow --      Pain Score 03/02/20 1211 7     Pain Loc --      Pain Edu? --      Excl. in [place]? --    No data found.  Updated Vital Signs BP (!) 168/84 (BP Location: Left Arm)   Pulse 96  Temp 98.1 F (36.7 C) (Oral)   Resp 15   SpO2 96%   Physical Exam Constitutional:      General: She is not in acute distress.    Appearance: Normal  appearance. She is well-developed. She is not ill-appearing, toxic-appearing or diaphoretic.  HENT:     Head: Normocephalic and atraumatic.     Right Ear: Tympanic membrane, ear canal and external ear normal. Tympanic membrane is not erythematous or bulging.     Left Ear: Tympanic membrane, ear canal and external ear normal. Tympanic membrane is not erythematous or bulging.     Nose: Congestion present.     Right Sinus: Maxillary sinus tenderness present. No frontal sinus tenderness.     Left Sinus: Maxillary sinus tenderness present. No frontal sinus tenderness.     Mouth/Throat:     Mouth: Mucous membranes are moist.     Pharynx: Oropharynx is clear. Uvula midline.  Eyes:     Conjunctiva/sclera: Conjunctivae normal.     Pupils: Pupils are equal, round, and reactive to light.  Cardiovascular:     Rate and Rhythm: Normal rate and regular rhythm.     Heart sounds: Murmur heard.  No friction rub. No gallop.   Pulmonary:     Effort: Pulmonary effort is normal. No accessory muscle usage, prolonged expiration, respiratory distress or retractions.     Breath sounds: No decreased air movement or transmitted upper airway sounds. No decreased breath sounds.     Comments: LCTAB Musculoskeletal:     Cervical back: Normal range of motion and neck supple.  Skin:    General: Skin is warm and dry.  Neurological:     Mental Status: She is alert and oriented to person, place, and time.    UC Treatments / Results  Labs (all labs ordered are listed, but only abnormal results are displayed) Labs Reviewed  NOVEL CORONAVIRUS, NAA    EKG   Radiology No results found.  Procedures Procedures (including critical care time)  Medications Ordered in UC Medications - No data to display  Initial Impression / Assessment and Plan / UC Course  I have reviewed the triage vital signs and the nursing notes.  Pertinent labs & imaging results that were available during my care of the patient were  reviewed by me and considered in my medical decision making (see chart for details).    COVID PCR test ordered. Patient to quarantine until testing results return. No alarming signs on exam. LCTAB. Symptomatic treatment discussed.  Push fluids. If symptoms not improving, can start Levaquin to cover for bacterial sinusitis. Return precautions given.  Patient expresses understanding and agrees to plan.  Final Clinical Impressions(s) / UC Diagnoses   Final diagnoses:  Viral URI   ED Prescriptions    Medication Sig Dispense Auth. Provider   fluticasone (FLONASE) 50 MCG/ACT nasal spray Place 2 sprays into both nostrils daily. 1 g Joshawa Dubin V, PA-C   azelastine (ASTELIN) 0.1 % nasal spray Place 2 sprays into both nostrils 2 (two) times daily. 30 mL Aleni Andrus V, PA-C   benzonatate (TESSALON) 200 MG capsule Take 1 capsule (200 mg total) by mouth every 8 (eight) hours. 21 capsule Nakiah Osgood V, PA-C   levofloxacin (LEVAQUIN) 750 MG tablet Take 1 tablet (750 mg total) by mouth daily. 7 tablet Esaias Cleavenger V, PA-C   fluconazole (DIFLUCAN) 150 MG tablet Take 1 tablet (150 mg total) by mouth daily. Take second dose 72 hours later if symptoms still persists. 2  tablet Ok Edwards, PA-C     PDMP not reviewed this encounter.   Ok Edwards, PA-C 03/02/20 1253

## 2020-03-02 NOTE — ED Triage Notes (Signed)
Patient c/o sore throat, nasal congestion, non-productive cough , "lymph node swelling", and body aches x Tuesday (last week).   Patient denies fever at home.   Patient endorses "I feel like I cant get any mucus up".   Patient endorses that her grandson has been sick w/ a "virus".    Patient has taken OTC cough and cold medication w/ no relief of symptoms.

## 2020-03-02 NOTE — Discharge Instructions (Signed)
COVID PCR testing ordered. I would like you to quarantine until testing results. Tessalon for cough. Start flonase, azelastine nasal spray for nasal congestion/drainage. You can use over the counter nasal saline rinse such as neti pot for nasal congestion. Keep hydrated, your urine should be clear to pale yellow in color. Tylenol/motrin for fever and pain. If experiencing shortness of breath, trouble breathing, go to the emergency department for further evaluation needed.   If symptoms not improving in 3-4 days, can fill antibiotic and start as directed.

## 2020-03-03 LAB — NOVEL CORONAVIRUS, NAA: SARS-CoV-2, NAA: NOT DETECTED

## 2020-03-03 LAB — SARS-COV-2, NAA 2 DAY TAT

## 2020-04-09 ENCOUNTER — Ambulatory Visit: Payer: PPO

## 2020-05-14 ENCOUNTER — Other Ambulatory Visit: Payer: Self-pay

## 2020-05-14 ENCOUNTER — Ambulatory Visit
Admission: RE | Admit: 2020-05-14 | Discharge: 2020-05-14 | Disposition: A | Discharge status: Home / Self Care | Payer: PPO | Source: Ambulatory Visit | Attending: Internal Medicine | Admitting: Internal Medicine

## 2020-05-14 DIAGNOSIS — Z1231 Encounter for screening mammogram for malignant neoplasm of breast: Secondary | ICD-10-CM | POA: Diagnosis not present

## 2020-10-29 ENCOUNTER — Ambulatory Visit: Payer: PPO | Admitting: Obstetrics and Gynecology

## 2020-11-13 ENCOUNTER — Ambulatory Visit: Payer: PPO | Admitting: Obstetrics and Gynecology

## 2020-11-26 NOTE — Progress Notes (Signed)
[name] here for breast and pelvic exam. Sexually active, uses lubricant which helps. No vaginal bleeding.   H/O breast cancer  H/O mixed incontinence. She has cut back on caffeine and her incontinence is much better. She does Kegel exercises.   No bowel c/o.     No LMP recorded. Patient is postmenopausal.          Sexually active: Yes.    The current method of family planning is oral progesterone-only contraceptive and post menopausal status.    Exercising: Yes.     Rebounder  Smoker:  no  Health Maintenance: Pap:  10/08/18 Neg HPV neg   08-01-17 neg,  07-26-16 neg History of abnormal Pap:  yes was treated with Cryo surgery.  MMG:  05/15/20 density B Bi-rads 1 neg  BMD:   2019 with PCP  Colonoscopy: 02/23/16 normal f/u 5 years  TDaP:  07/21/15  Gardasil: Na    reports that she has never smoked. She has never used smokeless tobacco. She reports that she does not drink alcohol and does not use drugs.She works for a [employer], fast food, does paperwork and billing. She wants to work. She has 2 daughters and a 68 year old grandson.   Past Medical History:  Diagnosis Date   Abnormal Pap smear of cervix    many yrs ago   Adenomatous colon polyp    Aortic regurgitation 04/09/2017   Breast cancer ([place])    Right breast-Invasive ductal   Carotid stenosis 04/09/2017   Diverticulosis    Ductal carcinoma of breast, stage 1 ([place]) 2010   Dyslipidemia    Essential hypertension 04/09/2017   Fatty liver 05/05/2011   Ultrasound    GERD (gastroesophageal reflux disease)    Hiatal hernia    Hx of radiation therapy 12/02/08- 12/08/08   right breast mammosite, 34 Gy 10 fractions, given twice daily   Hyperlipidemia 04/09/2017   Mitral and aortic regurgitation    MVP (mitral valve prolapse)    No antibiotics required for procedures   Osteopenia    Personal history of radiation therapy    Thyroid disease     Past Surgical History:  Procedure  Laterality Date   BREAST BIOPSY  2015   BREAST LUMPECTOMY Right 2010   BREAST SURGERY  2010   Lumpectomy; Right- Mammocyte   CRYOTHERAPY     abnormal pap smear   PELVIC LAPAROSCOPY      Current Outpatient Medications  Medication Sig Dispense Refill   Ascorbic Acid (VITAMIN C PO) Take by mouth.       b complex vitamins tablet Take 1 tablet by mouth daily.     Coenzyme Q10 (CO Q-10) 100 MG CAPS Take 1 capsule by mouth daily.     Cyanocobalamin (VITAMIN B-12) 1000 MCG SUBL Place 1 tablet under the tongue daily.     liothyronine (CYTOMEL) 5 MCG tablet Take 5 mcg by mouth daily.      Magnesium 400 MG TABS Take 1 tablet by mouth 2 (two) times daily.     Multiple Vitamin (MULTIVITAMIN WITH MINERALS) TABS tablet Take 1 tablet by mouth daily.     NALTREXONE HCL PO Take 4.5 mg by mouth daily.      Policosanol 10 MG CAPS 4 days a week     Red Yeast Rice 600 MG CAPS 3 days a week     UNABLE TO FIND Vitamin a,c, e, + selenium     UNABLE TO  FIND Mullein leaf '500mg'$      Misc Natural Products (TURMERIC CURCUMIN) CAPS Take 1 capsule by mouth daily. (Patient not taking: Reported on 11/27/2020)     No current facility-administered medications for this visit.    Family History  Problem Relation Age of Onset   Diabetes Mother        Type 1   Hypertension Mother    Heart disease Mother    Arthritis Mother    CAD Mother    Gallstones Mother    Stroke Mother    Thyroid disease Mother    Valvular heart disease Mother    Osteoporosis Sister    COPD Sister    Alcohol abuse Father    Osteopenia Sister    Breast cancer Maternal Grandmother    Ulcerative colitis Daughter    Heart disease Maternal Grandfather    Heart attack Maternal Grandfather    Kidney cancer Paternal Grandfather    Colon cancer Neg Hx     Review of Systems  All other systems reviewed and are negative.  Exam:   BP 134/70   Pulse (!) 110   Ht 5' 4.5" (1.638 m)   Wt 137 lb (62.1 kg)   SpO2 99%   BMI 23.15 kg/m   Weight  change: '@WEIGHTCHANGE'$ @ Height:   Height: 5' 4.5" (163.8 cm)  Ht Readings from Last 3 Encounters:  11/27/20 5' 4.5" (1.638 m)  10/28/19 5' 4.75" (1.645 m)  10/08/18 5' 4.75" (1.645 m)    General appearance: alert, cooperative and appears stated age Head: Normocephalic, without obvious abnormality, atraumatic Neck: no adenopathy, supple, symmetrical, trachea midline and thyroid normal to inspection and palpation Lungs: clear to auscultation bilaterally Cardiovascular: regular rate and rhythm Breasts: normal appearance, no masses or tenderness, evidence of right lumpectomy Abdomen: soft, non-tender; non distended,  no masses,  no organomegaly Extremities: extremities normal, atraumatic, no cyanosis or edema Skin: Skin color, texture, turgor normal. No rashes or lesions Lymph nodes: Cervical, supraclavicular, and axillary nodes normal. No abnormal inguinal nodes palpated Neurologic: Grossly normal   Pelvic: External genitalia:  no lesions              Urethra:  normal appearing urethra with no masses, tenderness or lesions              Bartholins and Skenes: normal                 Vagina: atrophic appearing vagina with normal color and discharge, no lesions              Cervix: no lesions               Bimanual Exam:  Uterus:  normal size, contour, position, consistency, mobility, non-tender              Adnexa: no mass, fullness, tenderness               Rectovaginal: Confirms               Anus:  normal sphincter tone, no lesions  Caryn Bee chaperoned for the exam.  1. Well woman exam Discussed breast self exam Discussed calcium and vit D intake Mammogram in 1/23 Colonoscopy due 11/22 No pap this year DEXA and labs with primary  2. History of breast cancer Doing well Mammogram UTD  3. Mixed incontinence Improved with cutting out caffeine.

## 2020-11-27 ENCOUNTER — Other Ambulatory Visit: Payer: Self-pay

## 2020-11-27 ENCOUNTER — Ambulatory Visit (INDEPENDENT_AMBULATORY_CARE_PROVIDER_SITE_OTHER): Payer: PPO | Admitting: Obstetrics and Gynecology

## 2020-11-27 ENCOUNTER — Encounter: Payer: Self-pay | Admitting: Obstetrics and Gynecology

## 2020-11-27 VITALS — BP 134/70 | HR 110 | Ht 64.5 in | Wt 137.0 lb

## 2020-11-27 DIAGNOSIS — Z9189 Other specified personal risk factors, not elsewhere classified: Secondary | ICD-10-CM | POA: Diagnosis not present

## 2020-11-27 DIAGNOSIS — N3946 Mixed incontinence: Secondary | ICD-10-CM

## 2020-11-27 DIAGNOSIS — Z853 Personal history of malignant neoplasm of breast: Secondary | ICD-10-CM | POA: Diagnosis not present

## 2020-11-27 DIAGNOSIS — Z01419 Encounter for gynecological examination (general) (routine) without abnormal findings: Secondary | ICD-10-CM

## 2020-12-17 DIAGNOSIS — E785 Hyperlipidemia, unspecified: Secondary | ICD-10-CM | POA: Diagnosis not present

## 2020-12-17 DIAGNOSIS — E559 Vitamin D deficiency, unspecified: Secondary | ICD-10-CM | POA: Diagnosis not present

## 2020-12-17 DIAGNOSIS — E039 Hypothyroidism, unspecified: Secondary | ICD-10-CM | POA: Diagnosis not present

## 2020-12-18 DIAGNOSIS — R7303 Prediabetes: Secondary | ICD-10-CM | POA: Diagnosis not present

## 2020-12-24 DIAGNOSIS — Z1339 Encounter for screening examination for other mental health and behavioral disorders: Secondary | ICD-10-CM | POA: Diagnosis not present

## 2020-12-24 DIAGNOSIS — E785 Hyperlipidemia, unspecified: Secondary | ICD-10-CM | POA: Diagnosis not present

## 2020-12-24 DIAGNOSIS — F419 Anxiety disorder, unspecified: Secondary | ICD-10-CM | POA: Diagnosis not present

## 2020-12-24 DIAGNOSIS — Z1331 Encounter for screening for depression: Secondary | ICD-10-CM | POA: Diagnosis not present

## 2020-12-24 DIAGNOSIS — E039 Hypothyroidism, unspecified: Secondary | ICD-10-CM | POA: Diagnosis not present

## 2020-12-24 DIAGNOSIS — E559 Vitamin D deficiency, unspecified: Secondary | ICD-10-CM | POA: Diagnosis not present

## 2020-12-24 DIAGNOSIS — I2510 Atherosclerotic heart disease of native coronary artery without angina pectoris: Secondary | ICD-10-CM | POA: Diagnosis not present

## 2020-12-24 DIAGNOSIS — Z853 Personal history of malignant neoplasm of breast: Secondary | ICD-10-CM | POA: Diagnosis not present

## 2020-12-24 DIAGNOSIS — K219 Gastro-esophageal reflux disease without esophagitis: Secondary | ICD-10-CM | POA: Diagnosis not present

## 2020-12-24 DIAGNOSIS — I6523 Occlusion and stenosis of bilateral carotid arteries: Secondary | ICD-10-CM | POA: Diagnosis not present

## 2020-12-24 DIAGNOSIS — Z0000 Encounter for general adult medical examination without abnormal findings: Secondary | ICD-10-CM | POA: Diagnosis not present

## 2020-12-30 DIAGNOSIS — Z20822 Contact with and (suspected) exposure to covid-19: Secondary | ICD-10-CM | POA: Diagnosis not present

## 2021-06-15 ENCOUNTER — Encounter: Payer: Self-pay | Admitting: Gastroenterology

## 2021-07-02 DIAGNOSIS — E039 Hypothyroidism, unspecified: Secondary | ICD-10-CM | POA: Diagnosis not present

## 2021-07-02 DIAGNOSIS — R809 Proteinuria, unspecified: Secondary | ICD-10-CM | POA: Diagnosis not present

## 2021-07-02 DIAGNOSIS — F419 Anxiety disorder, unspecified: Secondary | ICD-10-CM | POA: Diagnosis not present

## 2021-07-02 DIAGNOSIS — R5383 Other fatigue: Secondary | ICD-10-CM | POA: Diagnosis not present

## 2021-07-02 DIAGNOSIS — R0981 Nasal congestion: Secondary | ICD-10-CM | POA: Diagnosis not present

## 2021-07-02 DIAGNOSIS — E785 Hyperlipidemia, unspecified: Secondary | ICD-10-CM | POA: Diagnosis not present

## 2021-07-02 DIAGNOSIS — K219 Gastro-esophageal reflux disease without esophagitis: Secondary | ICD-10-CM | POA: Diagnosis not present

## 2021-07-02 DIAGNOSIS — R7303 Prediabetes: Secondary | ICD-10-CM | POA: Diagnosis not present

## 2021-07-02 DIAGNOSIS — R5381 Other malaise: Secondary | ICD-10-CM | POA: Diagnosis not present

## 2021-07-02 DIAGNOSIS — E559 Vitamin D deficiency, unspecified: Secondary | ICD-10-CM | POA: Diagnosis not present

## 2021-07-02 DIAGNOSIS — I2510 Atherosclerotic heart disease of native coronary artery without angina pectoris: Secondary | ICD-10-CM | POA: Diagnosis not present

## 2021-07-02 DIAGNOSIS — I6523 Occlusion and stenosis of bilateral carotid arteries: Secondary | ICD-10-CM | POA: Diagnosis not present

## 2021-07-02 DIAGNOSIS — Z853 Personal history of malignant neoplasm of breast: Secondary | ICD-10-CM | POA: Diagnosis not present

## 2021-12-01 NOTE — Progress Notes (Signed)
71 y.o. G45P1001 Married White or Caucasian Not Hispanic or Latino female here for annual exam.  No vaginal bleeding. Sexually active, some dryness.    H/O breast cancer.   H/O mixed incontinence. Improved with kegels.   She has been under a lot of stress. Her daughter is 77 and had a baby a month ago, stressful pregnancy (baby is fine). Husband has had medical issues.   Husband was hospitalized with a GI bleed in July and was diagnosed with an 11 cm renal mass. Surgery booked in 10/23. The bleed was from a diverticulum and stopped spontaneously.   No LMP recorded. Patient is postmenopausal.          Sexually active: Yes.    The current method of family planning is post menopausal status.    Exercising: No.  The patient does not participate in regular exercise at present. Smoker:  no  Health Maintenance: Pap:  10/08/18 Neg HPV neg   08-01-17 neg,  07-26-16 neg History of abnormal Pap:  yes was treated with Cryo surgery in her late 20's.  MMG:  05/15/20 density B Bi-rads 1 neg. She has had breast pain with lymphedema and will schedule her mammogram as soon as she can.   BMD:    2019 with PCP  Colonoscopy:  02/23/16 normal f/u 5 years  TDaP:  07/21/15 Gardasil: n/a   reports that she has never smoked. She has never used smokeless tobacco. She reports that she does not drink alcohol and does not use drugs. She has 2 daughters. One daughter has a 78.23 year old son and 65 month old daughter. They live 3 hours away in [place]. Other daughter lives in [place].   Past Medical History:  Diagnosis Date   Abnormal Pap smear of cervix    many yrs ago   Adenomatous colon polyp    Aortic regurgitation 04/09/2017   Breast cancer ([place])    Right breast-Invasive ductal   Carotid stenosis 04/09/2017   Diverticulosis    Ductal carcinoma of breast, stage 1 ([place]) 2010   Dyslipidemia    Essential hypertension 04/09/2017   Fatty liver 05/05/2011   Ultrasound    GERD (gastroesophageal reflux disease)    Hiatal  hernia    Hx of radiation therapy 12/02/08- 12/08/08   right breast mammosite, 34 Gy 10 fractions, given twice daily   Hyperlipidemia 04/09/2017   Mitral and aortic regurgitation    MVP (mitral valve prolapse)    No antibiotics required for procedures   Osteopenia    Personal history of radiation therapy    Thyroid disease     Past Surgical History:  Procedure Laterality Date   BREAST BIOPSY  2015   BREAST LUMPECTOMY Right 2010   BREAST SURGERY  2010   Lumpectomy; Right- Mammocyte   CRYOTHERAPY     abnormal pap smear   PELVIC LAPAROSCOPY      Current Outpatient Medications  Medication Sig Dispense Refill   Ascorbic Acid (VITAMIN C PO) Take by mouth.       b complex vitamins tablet Take 1 tablet by mouth daily.     Coenzyme Q10 (CO Q-10) 100 MG CAPS Take 1 capsule by mouth daily.     liothyronine (CYTOMEL) 5 MCG tablet Take 5 mcg by mouth daily.      Multiple Vitamin (MULTIVITAMIN WITH MINERALS) TABS tablet Take 1 tablet by mouth daily.     NALTREXONE HCL PO Take 4.5 mg by mouth daily.      No  current facility-administered medications for this visit.    Family History  Problem Relation Age of Onset   Diabetes Mother        Type 1   Hypertension Mother    Heart disease Mother    Arthritis Mother    CAD Mother    Gallstones Mother    Stroke Mother    Thyroid disease Mother    Valvular heart disease Mother    Osteoporosis Sister    COPD Sister    Alcohol abuse Father    Osteopenia Sister    Breast cancer Maternal Grandmother    Ulcerative colitis Daughter    Heart disease Maternal Grandfather    Heart attack Maternal Grandfather    Kidney cancer Paternal Grandfather    Colon cancer Neg Hx     Review of Systems  All other systems reviewed and are negative.   Exam:   BP (!) 160/80   Ht 5' 5.5" (1.664 m)   Wt 140 lb (63.5 kg)   BMI 22.94 kg/m   Weight change: '@WEIGHTCHANGE'$ @ Height:   Height: 5' 5.5" (166.4 cm)  Ht Readings from Last 3 Encounters:  12/09/21  5' 5.5" (1.664 m)  11/27/20 5' 4.5" (1.638 m)  10/28/19 5' 4.75" (1.645 m)    General appearance: alert, cooperative and appears stated age Head: Normocephalic, without obvious abnormality, atraumatic Neck: no adenopathy, supple, symmetrical, trachea midline and thyroid normal to inspection and palpation Breasts: normal appearance, no masses or tenderness, evidence of right lumpectomy Abdomen: soft, non-tender; non distended,  no masses,  no organomegaly Extremities: extremities normal, atraumatic, no cyanosis or edema Skin: Skin color, texture, turgor normal. No rashes or lesions Lymph nodes: Cervical, supraclavicular, and axillary nodes normal. No abnormal inguinal nodes palpated Neurologic: Grossly normal   Pelvic: External genitalia:  no lesions              Urethra:  normal appearing urethra with no masses, tenderness or lesions              Bartholins and Skenes: normal                 Vagina: normal appearing vagina with normal color and discharge, no lesions              Cervix: no lesions               Bimanual Exam:  Uterus:   no masses or tenderness              Adnexa: no mass, fullness, tenderness               Rectovaginal: Confirms               Anus:  normal sphincter tone, no lesions  Gae Dry chaperoned for the exam.  1. Encounter for breast and pelvic examination No pap this year Mammogram and colonoscopy are overdue she will schedule Labs with primary Discussed breast self exam Discussed calcium and vit D intake   2. History of breast cancer She will schedule her mammogram

## 2021-12-09 ENCOUNTER — Encounter: Payer: Self-pay | Admitting: Obstetrics and Gynecology

## 2021-12-09 ENCOUNTER — Ambulatory Visit (INDEPENDENT_AMBULATORY_CARE_PROVIDER_SITE_OTHER): Payer: PPO | Admitting: Obstetrics and Gynecology

## 2021-12-09 VITALS — BP 160/80 | Ht 65.5 in | Wt 140.0 lb

## 2021-12-09 DIAGNOSIS — Z01419 Encounter for gynecological examination (general) (routine) without abnormal findings: Secondary | ICD-10-CM

## 2021-12-09 DIAGNOSIS — Z853 Personal history of malignant neoplasm of breast: Secondary | ICD-10-CM

## 2021-12-09 NOTE — Patient Instructions (Addendum)
Try uberlube for vaginal lubrication  EXERCISE   We recommended that you start or continue a regular exercise program for good health. Physical activity is anything that gets your body moving, some is better than none. The CDC recommends 150 minutes per week of Moderate-Intensity Aerobic Activity and 2 or more days of Muscle Strengthening Activity.  Benefits of exercise are limitless: helps weight loss/weight maintenance, improves mood and energy, helps with depression and anxiety, improves sleep, tones and strengthens muscles, improves balance, improves bone density, protects from chronic conditions such as heart disease, high blood pressure and diabetes and so much more. To learn more visit: WhyNotPoker.uy  DIET: Good nutrition starts with a healthy diet of fruits, vegetables, whole grains, and lean protein sources. Drink plenty of water for hydration. Minimize empty calories, sodium, sweets. For more information about dietary recommendations visit: GeekRegister.com.ee and [email or web address]-mitchell.[email or web address]  ALCOHOL:  Women should limit their alcohol intake to no more than 7 drinks/beers/glasses of wine (combined, not each!) per week. Moderation of alcohol intake to this level decreases your risk of breast cancer and liver damage.  If you are concerned that you may have a problem, or your friends have told you they are concerned about your drinking, there are many resources to help. A well-known program that is free, effective, and available to all people all over the nation is Alcoholics Anonymous.  Check out this site to learn more: BlockTaxes.se   CALCIUM AND VITAMIN D:  Adequate intake of calcium and Vitamin D are recommended for bone health.  You should be getting between 1000-1200 mg of calcium and 800 units of Vitamin D daily between diet and supplements  MAMMOGRAMS:  All women over 23 years old should have a  routine mammogram.   COLON CANCER SCREENING: Now recommend starting at age 93. At this time colonoscopy is not covered for routine screening until 50. There are take home tests that can be done between 45-49.   COLONOSCOPY:  Colonoscopy to screen for colon cancer is recommended for all women at age 23.  We know, you hate the idea of the prep.  We agree, BUT, having colon cancer and not knowing it is worse!!  Colon cancer so often starts as a polyp that can be seen and removed at colonscopy, which can quite literally save your life!  And if your first colonoscopy is normal and you have no family history of colon cancer, most women don't have to have it again for 10 years.  Once every ten years, you can do something that may end up saving your life, right?  We will be happy to help you get it scheduled when you are ready.  Be sure to check your insurance coverage so you understand how much it will cost.  It may be covered as a preventative service at no cost, but you should check your particular policy.      Breast Self-Awareness Breast self-awareness means being familiar with how your breasts look and feel. It involves checking your breasts regularly and reporting any changes to your health care provider. Practicing breast self-awareness is important. A change in your breasts can be a sign of a serious medical problem. Being familiar with how your breasts look and feel allows you to find any problems early, when treatment is more likely to be successful. All women should practice breast self-awareness, including women who have had breast implants. How to do a breast self-exam One way to learn what is normal for your breasts and whether  your breasts are changing is to do a breast self-exam. To do a breast self-exam: Look for Changes  Remove all the clothing above your waist. Stand in front of a mirror in a room with good lighting. Put your hands on your hips. Push your hands firmly downward. Compare  your breasts in the mirror. Look for differences between them (asymmetry), such as: Differences in shape. Differences in size. Puckers, dips, and bumps in one breast and not the other. Look at each breast for changes in your skin, such as: Redness. Scaly areas. Look for changes in your nipples, such as: Discharge. Bleeding. Dimpling. Redness. A change in position. Feel for Changes Carefully feel your breasts for lumps and changes. It is best to do this while lying on your back on the floor and again while sitting or standing in the shower or tub with soapy water on your skin. Feel each breast in the following way: Place the arm on the side of the breast you are examining above your head. Feel your breast with the other hand. Start in the nipple area and make  inch (2 cm) overlapping circles to feel your breast. Use the pads of your three middle fingers to do this. Apply light pressure, then medium pressure, then firm pressure. The light pressure will allow you to feel the tissue closest to the skin. The medium pressure will allow you to feel the tissue that is a little deeper. The firm pressure will allow you to feel the tissue close to the ribs. Continue the overlapping circles, moving downward over the breast until you feel your ribs below your breast. Move one finger-width toward the center of the body. Continue to use the  inch (2 cm) overlapping circles to feel your breast as you move slowly up toward your collarbone. Continue the up and down exam using all three pressures until you reach your armpit.  Write Down What You Find  Write down what is normal for each breast and any changes that you find. Keep a written record with breast changes or normal findings for each breast. By writing this information down, you do not need to depend only on memory for size, tenderness, or location. Write down where you are in your menstrual cycle, if you are still menstruating. If you are having  trouble noticing differences in your breasts, do not get discouraged. With time you will become more familiar with the variations in your breasts and more comfortable with the exam. How often should I examine my breasts? Examine your breasts every month. If you are breastfeeding, the best time to examine your breasts is after a feeding or after using a breast pump. If you menstruate, the best time to examine your breasts is 5-7 days after your period is over. During your period, your breasts are lumpier, and it may be more difficult to notice changes. When should I see my health care provider? See your health care provider if you notice: A change in shape or size of your breasts or nipples. A change in the skin of your breast or nipples, such as a reddened or scaly area. Unusual discharge from your nipples. A lump or thick area that was not there before. Pain in your breasts. Anything that concerns you.

## 2021-12-31 DIAGNOSIS — E785 Hyperlipidemia, unspecified: Secondary | ICD-10-CM | POA: Diagnosis not present

## 2021-12-31 DIAGNOSIS — E559 Vitamin D deficiency, unspecified: Secondary | ICD-10-CM | POA: Diagnosis not present

## 2021-12-31 DIAGNOSIS — F419 Anxiety disorder, unspecified: Secondary | ICD-10-CM | POA: Diagnosis not present

## 2021-12-31 DIAGNOSIS — E039 Hypothyroidism, unspecified: Secondary | ICD-10-CM | POA: Diagnosis not present

## 2021-12-31 DIAGNOSIS — R5383 Other fatigue: Secondary | ICD-10-CM | POA: Diagnosis not present

## 2021-12-31 DIAGNOSIS — R7989 Other specified abnormal findings of blood chemistry: Secondary | ICD-10-CM | POA: Diagnosis not present

## 2022-01-03 ENCOUNTER — Other Ambulatory Visit: Payer: Self-pay | Admitting: *Deleted

## 2022-01-03 DIAGNOSIS — R0989 Other specified symptoms and signs involving the circulatory and respiratory systems: Secondary | ICD-10-CM

## 2022-01-07 DIAGNOSIS — R0981 Nasal congestion: Secondary | ICD-10-CM | POA: Diagnosis not present

## 2022-01-07 DIAGNOSIS — Z853 Personal history of malignant neoplasm of breast: Secondary | ICD-10-CM | POA: Diagnosis not present

## 2022-01-07 DIAGNOSIS — E785 Hyperlipidemia, unspecified: Secondary | ICD-10-CM | POA: Diagnosis not present

## 2022-01-07 DIAGNOSIS — I2510 Atherosclerotic heart disease of native coronary artery without angina pectoris: Secondary | ICD-10-CM | POA: Diagnosis not present

## 2022-01-07 DIAGNOSIS — I6523 Occlusion and stenosis of bilateral carotid arteries: Secondary | ICD-10-CM | POA: Diagnosis not present

## 2022-01-07 DIAGNOSIS — Z1331 Encounter for screening for depression: Secondary | ICD-10-CM | POA: Diagnosis not present

## 2022-01-07 DIAGNOSIS — E1169 Type 2 diabetes mellitus with other specified complication: Secondary | ICD-10-CM | POA: Diagnosis not present

## 2022-01-07 DIAGNOSIS — R5383 Other fatigue: Secondary | ICD-10-CM | POA: Diagnosis not present

## 2022-01-07 DIAGNOSIS — Z1339 Encounter for screening examination for other mental health and behavioral disorders: Secondary | ICD-10-CM | POA: Diagnosis not present

## 2022-01-07 DIAGNOSIS — Z0000 Encounter for general adult medical examination without abnormal findings: Secondary | ICD-10-CM | POA: Diagnosis not present

## 2022-01-07 DIAGNOSIS — K219 Gastro-esophageal reflux disease without esophagitis: Secondary | ICD-10-CM | POA: Diagnosis not present

## 2022-01-07 DIAGNOSIS — F419 Anxiety disorder, unspecified: Secondary | ICD-10-CM | POA: Diagnosis not present

## 2022-01-12 ENCOUNTER — Ambulatory Visit
Admission: RE | Admit: 2022-01-12 | Discharge: 2022-01-12 | Disposition: A | Discharge status: Home / Self Care | Payer: PPO | Source: Ambulatory Visit | Attending: Cardiovascular Disease | Admitting: Cardiovascular Disease

## 2022-01-12 DIAGNOSIS — R0989 Other specified symptoms and signs involving the circulatory and respiratory systems: Secondary | ICD-10-CM | POA: Diagnosis not present

## 2022-01-12 DIAGNOSIS — I6523 Occlusion and stenosis of bilateral carotid arteries: Secondary | ICD-10-CM | POA: Diagnosis not present

## 2022-04-07 DIAGNOSIS — J0100 Acute maxillary sinusitis, unspecified: Secondary | ICD-10-CM | POA: Diagnosis not present

## 2022-04-07 DIAGNOSIS — R0981 Nasal congestion: Secondary | ICD-10-CM | POA: Diagnosis not present

## 2022-04-07 DIAGNOSIS — Z1152 Encounter for screening for COVID-19: Secondary | ICD-10-CM | POA: Diagnosis not present

## 2022-04-07 DIAGNOSIS — J029 Acute pharyngitis, unspecified: Secondary | ICD-10-CM | POA: Diagnosis not present

## 2022-08-19 DIAGNOSIS — E785 Hyperlipidemia, unspecified: Secondary | ICD-10-CM | POA: Diagnosis not present

## 2022-08-19 DIAGNOSIS — G43909 Migraine, unspecified, not intractable, without status migrainosus: Secondary | ICD-10-CM | POA: Diagnosis not present

## 2022-08-19 DIAGNOSIS — R194 Change in bowel habit: Secondary | ICD-10-CM | POA: Diagnosis not present

## 2022-08-19 DIAGNOSIS — R6889 Other general symptoms and signs: Secondary | ICD-10-CM | POA: Diagnosis not present

## 2022-08-19 DIAGNOSIS — E039 Hypothyroidism, unspecified: Secondary | ICD-10-CM | POA: Diagnosis not present

## 2022-08-19 DIAGNOSIS — E559 Vitamin D deficiency, unspecified: Secondary | ICD-10-CM | POA: Diagnosis not present

## 2022-08-19 DIAGNOSIS — E1169 Type 2 diabetes mellitus with other specified complication: Secondary | ICD-10-CM | POA: Diagnosis not present

## 2022-08-19 DIAGNOSIS — R5383 Other fatigue: Secondary | ICD-10-CM | POA: Diagnosis not present

## 2022-08-19 DIAGNOSIS — R030 Elevated blood-pressure reading, without diagnosis of hypertension: Secondary | ICD-10-CM | POA: Diagnosis not present

## 2022-08-19 DIAGNOSIS — F439 Reaction to severe stress, unspecified: Secondary | ICD-10-CM | POA: Diagnosis not present

## 2022-11-21 DIAGNOSIS — Z780 Asymptomatic menopausal state: Secondary | ICD-10-CM | POA: Diagnosis not present

## 2023-03-24 DIAGNOSIS — E785 Hyperlipidemia, unspecified: Secondary | ICD-10-CM | POA: Diagnosis not present

## 2023-04-24 DIAGNOSIS — E785 Hyperlipidemia, unspecified: Secondary | ICD-10-CM | POA: Diagnosis not present

## 2023-04-24 DIAGNOSIS — Z0000 Encounter for general adult medical examination without abnormal findings: Secondary | ICD-10-CM | POA: Diagnosis not present

## 2023-04-24 DIAGNOSIS — M8580 Other specified disorders of bone density and structure, unspecified site: Secondary | ICD-10-CM | POA: Diagnosis not present

## 2023-04-24 DIAGNOSIS — Z1331 Encounter for screening for depression: Secondary | ICD-10-CM | POA: Diagnosis not present

## 2023-04-24 DIAGNOSIS — F419 Anxiety disorder, unspecified: Secondary | ICD-10-CM | POA: Diagnosis not present

## 2023-04-24 DIAGNOSIS — I2510 Atherosclerotic heart disease of native coronary artery without angina pectoris: Secondary | ICD-10-CM | POA: Diagnosis not present

## 2023-04-24 DIAGNOSIS — I6523 Occlusion and stenosis of bilateral carotid arteries: Secondary | ICD-10-CM | POA: Diagnosis not present

## 2023-04-24 DIAGNOSIS — Z853 Personal history of malignant neoplasm of breast: Secondary | ICD-10-CM | POA: Diagnosis not present

## 2023-04-24 DIAGNOSIS — G720 Drug-induced myopathy: Secondary | ICD-10-CM | POA: Diagnosis not present

## 2023-04-24 DIAGNOSIS — E1169 Type 2 diabetes mellitus with other specified complication: Secondary | ICD-10-CM | POA: Diagnosis not present

## 2023-04-24 DIAGNOSIS — E039 Hypothyroidism, unspecified: Secondary | ICD-10-CM | POA: Diagnosis not present

## 2023-04-24 DIAGNOSIS — Z1339 Encounter for screening examination for other mental health and behavioral disorders: Secondary | ICD-10-CM | POA: Diagnosis not present

## 2023-04-25 ENCOUNTER — Other Ambulatory Visit: Payer: Self-pay | Admitting: Internal Medicine

## 2023-04-25 DIAGNOSIS — I6523 Occlusion and stenosis of bilateral carotid arteries: Secondary | ICD-10-CM

## 2023-04-26 ENCOUNTER — Ambulatory Visit
Admission: RE | Admit: 2023-04-26 | Discharge: 2023-04-26 | Disposition: A | Discharge status: Home / Self Care | Payer: PPO | Source: Ambulatory Visit | Attending: Internal Medicine | Admitting: Internal Medicine

## 2023-04-26 DIAGNOSIS — I6523 Occlusion and stenosis of bilateral carotid arteries: Secondary | ICD-10-CM

## 2023-04-27 ENCOUNTER — Other Ambulatory Visit: Payer: Self-pay | Admitting: Internal Medicine

## 2023-04-27 DIAGNOSIS — I6523 Occlusion and stenosis of bilateral carotid arteries: Secondary | ICD-10-CM

## 2023-06-20 DIAGNOSIS — R0609 Other forms of dyspnea: Secondary | ICD-10-CM | POA: Diagnosis not present

## 2023-07-24 DIAGNOSIS — E785 Hyperlipidemia, unspecified: Secondary | ICD-10-CM | POA: Diagnosis not present

## 2023-07-24 DIAGNOSIS — G720 Drug-induced myopathy: Secondary | ICD-10-CM | POA: Diagnosis not present

## 2023-07-24 DIAGNOSIS — Z853 Personal history of malignant neoplasm of breast: Secondary | ICD-10-CM | POA: Diagnosis not present

## 2023-07-24 DIAGNOSIS — M8580 Other specified disorders of bone density and structure, unspecified site: Secondary | ICD-10-CM | POA: Diagnosis not present

## 2023-07-24 DIAGNOSIS — E559 Vitamin D deficiency, unspecified: Secondary | ICD-10-CM | POA: Diagnosis not present

## 2023-07-24 DIAGNOSIS — F419 Anxiety disorder, unspecified: Secondary | ICD-10-CM | POA: Diagnosis not present

## 2023-07-24 DIAGNOSIS — K219 Gastro-esophageal reflux disease without esophagitis: Secondary | ICD-10-CM | POA: Diagnosis not present

## 2023-07-24 DIAGNOSIS — R809 Proteinuria, unspecified: Secondary | ICD-10-CM | POA: Diagnosis not present

## 2023-07-24 DIAGNOSIS — E039 Hypothyroidism, unspecified: Secondary | ICD-10-CM | POA: Diagnosis not present

## 2023-07-24 DIAGNOSIS — I6523 Occlusion and stenosis of bilateral carotid arteries: Secondary | ICD-10-CM | POA: Diagnosis not present

## 2023-07-24 DIAGNOSIS — E1169 Type 2 diabetes mellitus with other specified complication: Secondary | ICD-10-CM | POA: Diagnosis not present

## 2023-07-24 DIAGNOSIS — I2510 Atherosclerotic heart disease of native coronary artery without angina pectoris: Secondary | ICD-10-CM | POA: Diagnosis not present

## 2023-11-13 DIAGNOSIS — E785 Hyperlipidemia, unspecified: Secondary | ICD-10-CM | POA: Diagnosis not present

## 2023-11-13 DIAGNOSIS — Z853 Personal history of malignant neoplasm of breast: Secondary | ICD-10-CM | POA: Diagnosis not present

## 2023-11-13 DIAGNOSIS — M8580 Other specified disorders of bone density and structure, unspecified site: Secondary | ICD-10-CM | POA: Diagnosis not present

## 2023-11-13 DIAGNOSIS — F419 Anxiety disorder, unspecified: Secondary | ICD-10-CM | POA: Diagnosis not present

## 2023-11-13 DIAGNOSIS — I6523 Occlusion and stenosis of bilateral carotid arteries: Secondary | ICD-10-CM | POA: Diagnosis not present

## 2023-11-13 DIAGNOSIS — E559 Vitamin D deficiency, unspecified: Secondary | ICD-10-CM | POA: Diagnosis not present

## 2023-11-13 DIAGNOSIS — I2510 Atherosclerotic heart disease of native coronary artery without angina pectoris: Secondary | ICD-10-CM | POA: Diagnosis not present

## 2023-11-13 DIAGNOSIS — G720 Drug-induced myopathy: Secondary | ICD-10-CM | POA: Diagnosis not present

## 2023-11-13 DIAGNOSIS — K219 Gastro-esophageal reflux disease without esophagitis: Secondary | ICD-10-CM | POA: Diagnosis not present

## 2023-11-13 DIAGNOSIS — R1310 Dysphagia, unspecified: Secondary | ICD-10-CM | POA: Diagnosis not present

## 2023-11-13 DIAGNOSIS — E039 Hypothyroidism, unspecified: Secondary | ICD-10-CM | POA: Diagnosis not present

## 2023-11-13 DIAGNOSIS — E1169 Type 2 diabetes mellitus with other specified complication: Secondary | ICD-10-CM | POA: Diagnosis not present

## 2023-12-22 DIAGNOSIS — H524 Presbyopia: Secondary | ICD-10-CM | POA: Diagnosis not present

## 2023-12-22 DIAGNOSIS — H43393 Other vitreous opacities, bilateral: Secondary | ICD-10-CM | POA: Diagnosis not present

## 2024-01-09 NOTE — Progress Notes (Signed)
 73 y.o. G1P1001 postmenopausal female with history of right breast cancer (2010), osteopenia, H/O mixed incontinence here for B&P exam- low risk. Married. PCP: Larnell Hamilton, MD   She reports urinary incontinence with sneezing.  Husband has metastatic renal cancer, now handicapped due to brain mets. Patient has had difficulty maintaining a healthy routine for herself due to caregiving for her husband. Urine sample provided: No  Postmenopausal bleeding: none Pelvic discharge or pain: none Breast mass, nipple discharge or skin changes : none Sexually active: Not currently due to husband's illness  Last PAP:     Component Value Date/Time   DIAGPAP  10/08/2018 0000    NEGATIVE FOR INTRAEPITHELIAL LESIONS OR MALIGNANCY.   DIAGPAP  08/01/2017 0000    NEGATIVE FOR INTRAEPITHELIAL LESIONS OR MALIGNANCY.   ADEQPAP  10/08/2018 0000    Satisfactory for evaluation  endocervical/transformation zone component PRESENT.   ADEQPAP  08/01/2017 0000    Satisfactory for evaluation  endocervical/transformation zone component PRESENT.   Last mammogram: 05/14/20 density B; bi-rads 1 neg. She has had residual breast pain and will schedule her mammogram as soon as she can.  Uses lidocaine patches for MMG. Last DXA: 2015, however managed by PCP, reports she has had a recent one Last colonoscopy: 02/23/16 10 yr recall  Exercising: No Smoker:No  Flowsheet Row Office Visit from 01/11/2024 in [facility]  PHQ-2 Total Score 0      GYN HISTORY: No sig hx  OB History  Gravida Para Term Preterm AB Living  1 1 1   1   SAB IAB Ectopic Multiple Live Births      1    # Outcome Date GA Lbr Len/2nd Weight Sex Type Anes PTL Lv  1 Term 12/1980 [redacted]w[redacted]d  7 lb 5 oz (3.317 kg) F Vag-Spont   LIV    Obstetric Comments  1 adopted also   Past Medical History:  Diagnosis Date   Abnormal Pap smear of cervix    many yrs ago   Adenomatous colon polyp    Aortic regurgitation  04/09/2017   Breast cancer (HCC)    Right breast-Invasive ductal   Carotid stenosis 04/09/2017   Diverticulosis    Ductal carcinoma of breast, stage 1 (HCC) 2010   Dyslipidemia    Essential hypertension 04/09/2017   Fatty liver 05/05/2011   Ultrasound    GERD (gastroesophageal reflux disease)    Hiatal hernia    Hx of radiation therapy 12/02/08- 12/08/08   right breast mammosite, 34 Gy 10 fractions, given twice daily   Hyperlipidemia 04/09/2017   Mitral and aortic regurgitation    MVP (mitral valve prolapse)    No antibiotics required for procedures   Osteopenia    Personal history of radiation therapy    Thyroid  disease    Past Surgical History:  Procedure Laterality Date   BREAST BIOPSY  2015   BREAST LUMPECTOMY Right 2010   BREAST SURGERY  2010   Lumpectomy; Right- Mammocyte   CRYOTHERAPY     abnormal pap smear   PELVIC LAPAROSCOPY     Current Outpatient Medications on File Prior to Visit  Medication Sig Dispense Refill   Ascorbic Acid (VITAMIN C PO) Take by mouth.       b complex vitamins tablet Take 1 tablet by mouth daily.     Cholecalciferol 125 MCG (5000 UT) capsule one tab once daily Oral     Coenzyme Q10 (CO Q-10) 100 MG CAPS Take 1 capsule by mouth daily.  liothyronine (CYTOMEL) 5 MCG tablet Take 5 mcg by mouth daily.      Multiple Vitamin (MULTIVITAMIN WITH MINERALS) TABS tablet Take 1 tablet by mouth daily.     NALTREXONE HCL PO Take 4.5 mg by mouth daily.      No current facility-administered medications on file prior to visit.   Social History   Socioeconomic History   Marital status: Married    Spouse name: Not on file   Number of children: 2   Years of education: Not on file   Highest education level: Not on file  Occupational History   Occupation: Print production planner  Tobacco Use   Smoking status: Never   Smokeless tobacco: Never  Substance and Sexual Activity   Alcohol  use: No    Alcohol /week: 0.0 standard drinks of alcohol    Drug use: No    Sexual activity: Not Currently    Partners: Male    Birth control/protection: Post-menopausal  Other Topics Concern   Not on file  Social History Narrative   Caffeine daily    Social Drivers of Corporate investment banker Strain: Not on file  Food Insecurity: Not on file  Transportation Needs: Not on file  Physical Activity: Not on file  Stress: Not on file  Social Connections: Not on file  Intimate Partner Violence: Not on file   Family History  Problem Relation Age of Onset   Diabetes Mother        Type 1   Hypertension Mother    Heart disease Mother    Arthritis Mother    CAD Mother    Gallstones Mother    Stroke Mother    Thyroid  disease Mother    Valvular heart disease Mother    Osteoporosis Sister    COPD Sister    Alcohol  abuse Father    Osteopenia Sister    Breast cancer Maternal Grandmother    Ulcerative colitis Daughter    Heart disease Maternal Grandfather    Heart attack Maternal Grandfather    Kidney cancer Paternal Grandfather    Colon cancer Neg Hx    Allergies  Allergen Reactions   Erythromycin     Stomach Distress   Penicillins     REACTION: RASH   Tetracycline     REACTION: Raging Yeast Infections      PE Today's Vitals   01/11/24 1104  BP: (!) 156/80  Pulse: 94  Temp: 97.6 F (36.4 C)  TempSrc: Oral  SpO2: 97%  Weight: 136 lb (61.7 kg)  Height: 5' 5.25 (1.657 m)   Body mass index is 22.46 kg/m.  Physical Exam Vitals reviewed. Exam conducted with a chaperone present.  Constitutional:      General: She is not in acute distress.    Appearance: Normal appearance.  HENT:     Head: Normocephalic and atraumatic.     Nose: Nose normal.  Eyes:     Extraocular Movements: Extraocular movements intact.     Conjunctiva/sclera: Conjunctivae normal.  Neck:     Thyroid : No thyroid  mass, thyromegaly or thyroid  tenderness.  Pulmonary:     Effort: Pulmonary effort is normal.  Chest:     Chest wall: No mass or tenderness.  Breasts:     Right: Normal. No swelling, mass, nipple discharge, skin change or tenderness.     Left: Normal. No swelling, mass, nipple discharge, skin change or tenderness.    Abdominal:     General: There is no distension.     Palpations: Abdomen is  soft.     Tenderness: There is no abdominal tenderness.  Genitourinary:    General: Normal vulva.     Exam position: Lithotomy position.     Urethra: No prolapse.     Vagina: Normal. No vaginal discharge or bleeding.     Cervix: Normal. No lesion.     Uterus: Normal. Not enlarged and not tender.      Adnexa: Right adnexa normal and left adnexa normal.  Musculoskeletal:        General: Normal range of motion.     Cervical back: Normal range of motion.  Lymphadenopathy:     Upper Body:     Right upper body: No axillary adenopathy.     Left upper body: No axillary adenopathy.     Lower Body: No right inguinal adenopathy. No left inguinal adenopathy.  Skin:    General: Skin is warm and dry.  Neurological:     General: No focal deficit present.     Mental Status: She is alert.  Psychiatric:        Mood and Affect: Mood normal.        Behavior: Behavior normal.      Assessment and Plan:        Encounter for breast and pelvic examination Assessment & Plan: Cervical cancer screening performed according to patient preference Encouraged annual mammogram screening Colonoscopy UTD DXA continue to follow with PCP Labs and immunizations with her primary Encouraged safe sexual practices as indicated Encouraged healthy lifestyle practices with diet and exercise For patients over 70yo, I recommend 1200mg  calcium daily and 800IU of vitamin D  daily.    Screening for depression  Cervical cancer screening -     Cytology - PAP  Expressed interest in annual exams, reviewed q58yr B&P per medicare- she will schedule accordingly  Brooke LULLA Pa, MD

## 2024-01-11 ENCOUNTER — Other Ambulatory Visit (HOSPITAL_COMMUNITY)
Admission: RE | Admit: 2024-01-11 | Discharge: 2024-01-11 | Disposition: A | Source: Ambulatory Visit | Attending: Obstetrics and Gynecology | Admitting: Obstetrics and Gynecology

## 2024-01-11 ENCOUNTER — Ambulatory Visit (INDEPENDENT_AMBULATORY_CARE_PROVIDER_SITE_OTHER): Admitting: Obstetrics and Gynecology

## 2024-01-11 ENCOUNTER — Encounter: Payer: Self-pay | Admitting: Obstetrics and Gynecology

## 2024-01-11 ENCOUNTER — Other Ambulatory Visit: Payer: Self-pay | Admitting: Obstetrics and Gynecology

## 2024-01-11 VITALS — BP 156/80 | HR 94 | Temp 97.6°F | Ht 65.25 in | Wt 136.0 lb

## 2024-01-11 DIAGNOSIS — Z01419 Encounter for gynecological examination (general) (routine) without abnormal findings: Secondary | ICD-10-CM

## 2024-01-11 DIAGNOSIS — Z1231 Encounter for screening mammogram for malignant neoplasm of breast: Secondary | ICD-10-CM

## 2024-01-11 DIAGNOSIS — Z124 Encounter for screening for malignant neoplasm of cervix: Secondary | ICD-10-CM | POA: Insufficient documentation

## 2024-01-11 DIAGNOSIS — Z1331 Encounter for screening for depression: Secondary | ICD-10-CM

## 2024-01-11 DIAGNOSIS — M8580 Other specified disorders of bone density and structure, unspecified site: Secondary | ICD-10-CM

## 2024-01-11 DIAGNOSIS — N3946 Mixed incontinence: Secondary | ICD-10-CM

## 2024-01-11 NOTE — Assessment & Plan Note (Addendum)
 Cervical cancer screening performed according to patient preference Encouraged annual mammogram screening, due! Colonoscopy UTD DXA continue to follow with PCP Labs and immunizations with her primary Encouraged safe sexual practices as indicated Encouraged healthy lifestyle practices with diet and exercise For patients over 74yo, I recommend 1200mg  calcium daily and 800IU of vitamin D  daily.

## 2024-01-11 NOTE — Patient Instructions (Signed)
 For patients under 50-74yo, I recommend 1200mg  calcium  daily and 600IU of vitamin D daily. For patients over 74yo, I recommend 1200mg  calcium  daily and 800IU of vitamin D daily.  Health Maintenance, Female Adopting a healthy lifestyle and getting preventive care are important in promoting health and wellness. Ask your health care provider about: The right schedule for you to have regular tests and exams. Things you can do on your own to prevent diseases and keep yourself healthy. What should I know about diet, weight, and exercise? Eat a healthy diet  Eat a diet that includes plenty of vegetables, fruits, low-fat dairy products, and lean protein. Do not eat a lot of foods that are high in solid fats, added sugars, or sodium. Maintain a healthy weight Body mass index (BMI) is used to identify weight problems. It estimates body fat based on height and weight. Your health care provider can help determine your BMI and help you achieve or maintain a healthy weight. Get regular exercise Get regular exercise. This is one of the most important things you can do for your health. Most adults should: Exercise for at least 150 minutes each week. The exercise should increase your heart rate and make you sweat (moderate-intensity exercise). Do strengthening exercises at least twice a week. This is in addition to the moderate-intensity exercise. Spend less time sitting. Even light physical activity can be beneficial. Watch cholesterol and blood lipids Have your blood tested for lipids and cholesterol at 74 years of age, then have this test every 5 years. Have your cholesterol levels checked more often if: Your lipid or cholesterol levels are high. You are older than 74 years of age. You are at high risk for heart disease. What should I know about cancer screening? Depending on your health history and family history, you may need to have cancer screening at various ages. This may include screening  for: Breast cancer. Cervical cancer. Colorectal cancer. Skin cancer. Lung cancer. What should I know about heart disease, diabetes, and high blood pressure? Blood pressure and heart disease High blood pressure causes heart disease and increases the risk of stroke. This is more likely to develop in people who have high blood pressure readings or are overweight. Have your blood pressure checked: Every 3-5 years if you are 25-57 years of age. Every year if you are 24 years old or older. Diabetes Have regular diabetes screenings. This checks your fasting blood sugar level. Have the screening done: Once every three years after age 62 if you are at a normal weight and have a low risk for diabetes. More often and at a younger age if you are overweight or have a high risk for diabetes. What should I know about preventing infection? Hepatitis B If you have a higher risk for hepatitis B, you should be screened for this virus. Talk with your health care provider to find out if you are at risk for hepatitis B infection. Hepatitis C Testing is recommended for: Everyone born from [birth year] through 1965. Anyone with known risk factors for hepatitis C. Sexually transmitted infections (STIs) Get screened for STIs, including gonorrhea and chlamydia, if: You are sexually active and are younger than 74 years of age. You are older than 74 years of age and your health care provider tells you that you are at risk for this type of infection. Your sexual activity has changed since you were last screened, and you are at increased risk for chlamydia or gonorrhea. Ask your health care provider if  you are at risk. Ask your health care provider about whether you are at high risk for HIV. Your health care provider may recommend a prescription medicine to help prevent HIV infection. If you choose to take medicine to prevent HIV, you should first get tested for HIV. You should then be tested every 3 months for as long as you  are taking the medicine. Osteoporosis and menopause Osteoporosis is a disease in which the bones lose minerals and strength with aging. This can result in bone fractures. If you are 72 years old or older, or if you are at risk for osteoporosis and fractures, ask your health care provider if you should: Be screened for bone loss. Take a calcium  or vitamin D supplement to lower your risk of fractures. Be given hormone replacement therapy (HRT) to treat symptoms of menopause. Follow these instructions at home: Alcohol use Do not drink alcohol if: Your health care provider tells you not to drink. You are pregnant, may be pregnant, or are planning to become pregnant. If you drink alcohol: Limit how much you have to: 0-1 drink a day. Know how much alcohol is in your drink. In the [place] one drink equals one 12 oz bottle of beer (355 mL), one 5 oz glass of wine (148 mL), or one 1 oz glass of hard liquor (44 mL). Lifestyle Do not use any products that contain nicotine or tobacco. These products include cigarettes, chewing tobacco, and vaping devices, such as e-cigarettes. If you need help quitting, ask your health care provider. Do not use street drugs. Do not share needles. Ask your health care provider for help if you need support or information about quitting drugs. General instructions Schedule regular health, dental, and eye exams. Stay current with your vaccines. Tell your health care provider if: You often feel depressed. You have ever been abused or do not feel safe at home. Summary Adopting a healthy lifestyle and getting preventive care are important in promoting health and wellness. Follow your health care provider's instructions about healthy diet, exercising, and getting tested or screened for diseases. Follow your health care provider's instructions on monitoring your cholesterol and blood pressure. This information is not intended to replace advice given to you by your health  care provider. Make sure you discuss any questions you have with your health care provider. Document Revised: 08/24/2020 Document Reviewed: 08/24/2020 Elsevier Patient Education  2024 [employer].

## 2024-01-12 LAB — CYTOLOGY - PAP
Adequacy: ABSENT
Diagnosis: NEGATIVE

## 2024-01-15 ENCOUNTER — Ambulatory Visit: Payer: Self-pay | Admitting: Obstetrics and Gynecology

## 2024-01-24 ENCOUNTER — Ambulatory Visit
Admission: RE | Admit: 2024-01-24 | Discharge: 2024-01-24 | Disposition: A | Discharge status: Home / Self Care | Source: Ambulatory Visit | Attending: Obstetrics and Gynecology | Admitting: Obstetrics and Gynecology

## 2024-01-24 DIAGNOSIS — Z1231 Encounter for screening mammogram for malignant neoplasm of breast: Secondary | ICD-10-CM

## 2024-01-30 ENCOUNTER — Ambulatory Visit: Payer: Self-pay | Admitting: Obstetrics and Gynecology

## 2024-02-09 ENCOUNTER — Encounter: Payer: Self-pay | Admitting: Internal Medicine

## 2024-03-13 ENCOUNTER — Encounter: Payer: Self-pay | Admitting: Gastroenterology

## 2024-04-03 NOTE — Progress Notes (Signed)
 Brooke Wilkinson                                          MRN: [id number]   04/03/2024   The [facility] Quality Team Specialist reviewed this patient medical record for the purposes of chart review for care gap closure. The following were reviewed: chart review for care gap closure-kidney health evaluation for diabetes:eGFR  and uACR.    [facility] Quality Team

## 2024-04-13 ENCOUNTER — Emergency Department (HOSPITAL_BASED_OUTPATIENT_CLINIC_OR_DEPARTMENT_OTHER)
Admission: EM | Admit: 2024-04-13 | Discharge: 2024-04-13 | Disposition: A | Discharge status: Home / Self Care | Attending: Emergency Medicine | Admitting: Emergency Medicine

## 2024-04-13 ENCOUNTER — Other Ambulatory Visit: Payer: Self-pay

## 2024-04-13 ENCOUNTER — Encounter (HOSPITAL_BASED_OUTPATIENT_CLINIC_OR_DEPARTMENT_OTHER): Payer: Self-pay

## 2024-04-13 DIAGNOSIS — R059 Cough, unspecified: Secondary | ICD-10-CM | POA: Diagnosis present

## 2024-04-13 DIAGNOSIS — J111 Influenza due to unidentified influenza virus with other respiratory manifestations: Secondary | ICD-10-CM

## 2024-04-13 DIAGNOSIS — J101 Influenza due to other identified influenza virus with other respiratory manifestations: Secondary | ICD-10-CM | POA: Diagnosis not present

## 2024-04-13 DIAGNOSIS — R197 Diarrhea, unspecified: Secondary | ICD-10-CM

## 2024-04-13 DIAGNOSIS — J029 Acute pharyngitis, unspecified: Secondary | ICD-10-CM

## 2024-04-13 LAB — BASIC METABOLIC PANEL WITH GFR
Anion gap: 13 (ref 5–15)
BUN: 10 mg/dL (ref 8–23)
CO2: 24 mmol/L (ref 22–32)
Calcium: 9.3 mg/dL (ref 8.9–10.3)
Chloride: 98 mmol/L (ref 98–111)
Creatinine, Ser: 0.65 mg/dL (ref 0.44–1.00)
GFR, Estimated: >60 mL/min
Glucose, Bld: 161 mg/dL — ABNORMAL HIGH (ref 70–99)
Potassium: 3.9 mmol/L (ref 3.5–5.1)
Sodium: 135 mmol/L (ref 135–145)

## 2024-04-13 LAB — CBC
HCT: 40.3 % (ref 36.0–46.0)
Hemoglobin: 14.1 g/dL (ref 12.0–15.0)
MCH: 31.9 pg (ref 26.0–34.0)
MCHC: 35 g/dL (ref 30.0–36.0)
MCV: 91.2 fL (ref 80.0–100.0)
Platelets: 188 K/uL (ref 150–400)
RBC: 4.42 MIL/uL (ref 3.87–5.11)
RDW: 11.8 % (ref 11.5–15.5)
WBC: 10.5 K/uL (ref 4.0–10.5)
nRBC: 0 % (ref 0.0–0.2)

## 2024-04-13 LAB — MAGNESIUM: Magnesium: 2 mg/dL (ref 1.7–2.4)

## 2024-04-13 LAB — RESP PANEL BY RT-PCR (RSV, FLU A&B, COVID)  RVPGX2
Influenza A by PCR: POSITIVE — AB
Influenza B by PCR: NEGATIVE
Resp Syncytial Virus by PCR: NEGATIVE
SARS Coronavirus 2 by RT PCR: NEGATIVE

## 2024-04-13 LAB — GROUP A STREP BY PCR: Group A Strep by PCR: NOT DETECTED

## 2024-04-13 MED ORDER — KETOROLAC TROMETHAMINE 30 MG/ML IJ SOLN
30.0000 mg | Freq: Once | INTRAMUSCULAR | Status: AC
  Administered 2024-04-13: 30 mg via INTRAVENOUS
  Filled 2024-04-13: qty 1

## 2024-04-13 MED ORDER — SODIUM CHLORIDE 0.9 % IV BOLUS
1000.0000 mL | Freq: Once | INTRAVENOUS | Status: AC
  Administered 2024-04-13: 1000 mL via INTRAVENOUS

## 2024-04-13 MED ORDER — DEXAMETHASONE SOD PHOSPHATE PF 10 MG/ML IJ SOLN
6.0000 mg | Freq: Once | INTRAMUSCULAR | Status: AC
  Administered 2024-04-13: 6 mg via INTRAVENOUS

## 2024-04-13 NOTE — ED Triage Notes (Signed)
 Sore throat, diarrhea, cough, congestion, body aches for 4 days.

## 2024-04-13 NOTE — ED Notes (Signed)
 Ambulated patient off of oxygen around nurses station on pulse oximetry. Maintained SpO2 at 94% on RA. Provider notified

## 2024-04-13 NOTE — ED Notes (Signed)
 Discharge instructions reviewed with patient. Patient verbalizes understanding, no further questions at this time. Medications and follow up information provided. No acute distress noted at time of departure.

## 2024-04-13 NOTE — ED Provider Notes (Signed)
 " [facility] Provider Note   CSN: [id number] Arrival date & time: 04/13/24  1037     Patient presents with: Sore Throat   Brooke Wilkinson is a 73 y.o. female presenting to ED with 3 to 4 days of bodyaches, congestion, persistent coughing, diarrhea, poor appetite.  Her husband has similar symptoms in the house.   HPI     Prior to Admission medications  Medication Sig Start Date End Date Taking? Authorizing Provider  Ascorbic Acid (VITAMIN C PO) Take by mouth.      [provider]  b complex vitamins tablet Take 1 tablet by mouth daily.    [provider]  Cholecalciferol 125 MCG (5000 UT) capsule one tab once daily Oral 10/11/16   [provider]  Coenzyme Q10 (CO Q-10) 100 MG CAPS Take 1 capsule by mouth daily.    [provider]  liothyronine (CYTOMEL) 5 MCG tablet Take 5 mcg by mouth daily.     [provider]  Multiple Vitamin (MULTIVITAMIN WITH MINERALS) TABS tablet Take 1 tablet by mouth daily.    [provider]  NALTREXONE HCL PO Take 4.5 mg by mouth daily.     [provider]    Allergies: Erythromycin, Penicillins, and Tetracycline    Review of Systems  Updated Vital Signs BP (!) 140/54   Pulse 95   Temp 98.9 F (37.2 C)   Resp 16   SpO2 95%   Physical Exam Constitutional:      General: She is not in acute distress.    Comments: Hoarse voice  HENT:     Head: Normocephalic and atraumatic.  Eyes:     Conjunctiva/sclera: Conjunctivae normal.     Pupils: Pupils are equal, round, and reactive to light.  Cardiovascular:     Rate and Rhythm: Regular rhythm. Tachycardia present.  Pulmonary:     Effort: Pulmonary effort is normal. No respiratory distress.  Abdominal:     General: There is no distension.     Tenderness: There is no abdominal tenderness.  Skin:    General: Skin is warm and dry.  Neurological:     General: No focal deficit present.      Mental Status: She is alert. Mental status is at baseline.  Psychiatric:        Mood and Affect: Mood normal.        Behavior: Behavior normal.     (all labs ordered are listed, but only abnormal results are displayed) Labs Reviewed  RESP PANEL BY RT-PCR (RSV, FLU A&B, COVID)  RVPGX2 - Abnormal; Notable for the following components:      Result Value   Influenza A by PCR POSITIVE (*)    All other components within normal limits  BASIC METABOLIC PANEL WITH GFR - Abnormal; Notable for the following components:   Glucose, Bld 161 (*)    All other components within normal limits  GROUP A STREP BY PCR  CBC  MAGNESIUM    EKG: None  Radiology: No results found.   Procedures   Medications Ordered in the ED  sodium chloride  0.9 % bolus 1,000 mL (1,000 mLs Intravenous New Bag/Given 04/13/24 1158)  ketorolac  (TORADOL ) 30 MG/ML injection 30 mg (30 mg Intravenous Given 04/13/24 1203)  dexamethasone  (DECADRON ) injection 6 mg (6 mg Intravenous Given 04/13/24 1204)    Clinical Course as of 04/13/24 1305  Sat Apr 13, 2024  1245 Amb pulse ox at 94% on room  air per RN, patient is not requiring supplemental oxygen [MT]    Clinical Course User Index [MT] Bernon Arviso, Donnice PARAS, MD                                 Medical Decision Making Amount and/or Complexity of Data Reviewed Labs: ordered.  Risk Prescription drug management.   Patient is here with flulike syndrome, influenza test is positive.  Strongly suspect this is the cause of her symptoms.  Doubt bacterial infection or pneumonia.  I think is reasonable to check labs and give IV fluids given her concern for 3 days of diarrhea and possible dehydration.  Also ordered IV Toradol  for her generalized bodyaches and Decadron  as well.  Her strep test is negative.  Her voice has evidence of viral laryngitis.  No evidence of airway compromise  I personally reviewed interpret the patient's labs.  Her influenza test is positive.  This is  likely the cause of her symptoms.  Thankfully, her electrolytes and kidney function are within normal limits, she has no acute anemia.  She does maintain oxygen saturation 94% or higher at rest and with ambulation.  She is not in acute respiratory distress.  I think she is reasonable for discharge home, prefer to do so.  I do not see an indication for hospitalization otherwise.  Advised conservative care at home for the next several days, as I anticipate her symptoms will improve as fluid finishes running its course.  She is in agreement with this plan.  No indication for Tamiflu, given concern also for side effects including GI upset, as well as delayed onset and presentation     Final diagnoses:  Viral pharyngitis  Influenza  Diarrhea, unspecified type    ED Discharge Orders     None          Cottie Donnice PARAS, MD 04/13/24 1305  "

## 2024-04-13 NOTE — ED Notes (Signed)
 Pt SpO2 noted to be 90% on RA. Placed on 2L via  with improvement to 96%

## 2024-04-26 ENCOUNTER — Encounter: Payer: Self-pay | Admitting: Gastroenterology

## 2024-04-26 ENCOUNTER — Ambulatory Visit: Admitting: Gastroenterology

## 2024-04-26 VITALS — BP 124/66 | HR 100 | Ht 65.25 in | Wt 132.0 lb

## 2024-04-26 DIAGNOSIS — R1310 Dysphagia, unspecified: Secondary | ICD-10-CM | POA: Insufficient documentation

## 2024-04-26 DIAGNOSIS — K219 Gastro-esophageal reflux disease without esophagitis: Secondary | ICD-10-CM | POA: Diagnosis not present

## 2024-04-26 NOTE — Progress Notes (Signed)
 "    04/26/2024 Brooke Wilkinson [id number] May 31, 1950   Discussed the use of AI scribe software for clinical note transcription with the patient, who gave verbal consent to proceed.  History of Present Illness Brooke Wilkinson is a 74 year old female with hiatal hernia and prior breast cancer who presents for evaluation of worsening acid reflux and dysphagia.  Over the past year, she has experienced progressive acid reflux and dysphagia. Episodes of food impaction in the chest occur most frequently during dinner, initially daily and now with any meal, regardless of food type. Relief is achieved only by regurgitating the food. Dietary modifications, including avoidance of tomatoes, tomato sauce, and spicy foods, have not improved symptoms.  She uses over-the-counter famotidine 20 mg, typically once daily in the evening, and occasionally twice daily. Famotidine provides some relief, though she prefers to minimize medication use.  Had EGD in 05/2011 with stricture that was dilated, hiatal hernia, and fundic gland polyps.  1. Surgical Surgical [P], gastric, gastric, polyp - FUNDIC GLAND POLYP. - THERE IS NO EVIDENCE EVIDENCE OF GOBLET CELL METAPLASIA, METAPLASIA, DYSPLASIA, DYSPLASIA, OR MALIGNANCY. MALIGNANCY. - SEE COMMENT. COMMENT. 2. Surgical Surgical [P], GE junction junction - GASTROESOPHAGEAL GASTROESOPHAGEAL JUNCTION JUNCTION MUCOSA WITH INFLAMMATION INFLAMMATION CONSISTENT CONSISTENT WITH GASTROESOPHAGEAL GASTROESOPHAGEAL REFLUX. REFLUX. - THERE IS NO EVIDENCE EVIDENCE OF GOBLET CELL METAPLASIA, METAPLASIA, DYSPLASIA DYSPLASIA OR MALIGNANCY. MALIGNANCY. - SEE COMMENT.  For the past 18 days, she has had a flu-like illness with persistent cough, fatigue, and significantly reduced stamina, resulting in decreased oral intake.  Would like to do EGD in about 2 months to be sure she is completely recovered.   Past Medical History:  Diagnosis Date   Abnormal Pap smear of cervix    many yrs  ago   Adenomatous colon polyp    Aortic regurgitation 04/09/2017   Breast cancer (HCC)    Right breast-Invasive ductal   Carotid stenosis 04/09/2017   Diverticulosis    Ductal carcinoma of breast, stage 1 (HCC) 2010   Dyslipidemia    Essential hypertension 04/09/2017   Fatty liver 05/05/2011   Ultrasound    GERD (gastroesophageal reflux disease)    Hiatal hernia    Hx of radiation therapy 12/02/08- 12/08/08   right breast mammosite, 34 Gy 10 fractions, given twice daily   Hyperlipidemia 04/09/2017   Mitral and aortic regurgitation    MVP (mitral valve prolapse)    No antibiotics required for procedures   Osteopenia    Personal history of radiation therapy    Thyroid  disease    Past Surgical History:  Procedure Laterality Date   BREAST BIOPSY  2015   BREAST LUMPECTOMY Right 2010   BREAST SURGERY  2010   Lumpectomy; Right- Mammocyte   CRYOTHERAPY     abnormal pap smear   PELVIC LAPAROSCOPY      reports that she has never smoked. She has never used smokeless tobacco. She reports that she does not drink alcohol  and does not use drugs. family history includes Alcohol  abuse in her father; Arthritis in her mother; Breast cancer in her maternal grandmother; CAD in her mother; COPD in her sister; Diabetes in her mother; Gallstones in her mother; Heart attack in her maternal grandfather; Heart disease in her maternal grandfather and mother; Hypertension in her mother; Kidney cancer in her paternal grandfather; Osteopenia in her sister; Osteoporosis in her sister; Stroke in her mother; Thyroid  disease in her mother; Ulcerative colitis in her daughter; Valvular heart disease in her mother. Allergies[1]  Outpatient Encounter Medications as of 04/26/2024  Medication Sig   Ascorbic Acid (VITAMIN C PO) Take by mouth.     b complex vitamins tablet Take 1 tablet by mouth daily.   Cholecalciferol 125 MCG (5000 UT) capsule one tab once daily Oral   Coenzyme Q10 (CO Q-10) 100 MG CAPS Take 1  capsule by mouth daily.   liothyronine (CYTOMEL) 5 MCG tablet Take 5 mcg by mouth daily.    Multiple Vitamin (MULTIVITAMIN WITH MINERALS) TABS tablet Take 1 tablet by mouth daily.   NALTREXONE HCL PO Take 4.5 mg by mouth daily.    cholecalciferol (VITAMIN D3) 25 MCG (1000 UNIT) tablet Take 1,000 Units by mouth daily. (Patient not taking: Reported on 04/26/2024)   No facility-administered encounter medications on file as of 04/26/2024.    REVIEW OF SYSTEMS  : All other systems reviewed and negative except where noted in the History of Present Illness.   PHYSICAL EXAM: BP 124/66   Pulse 100   Ht 5' 5.25 (1.657 m)   Wt 132 lb (59.9 kg)   BMI 21.80 kg/m  General: Well developed white female in no acute distress Head: Normocephalic and atraumatic Eyes:  sclerae anicteric,conjunctive pink. Ears: Normal auditory acuity Lungs: Clear throughout to auscultation Heart: Regular rate and rhythm Abdomen: Soft, nontender, non distended. No masses or hepatomegaly noted. Normal bowel sounds Musculoskeletal: Symmetrical with no gross deformities  Skin: No lesions on visible extremities Extremities: No edema  Neurological: Alert oriented x 4, grossly non-focal Psychological:  Alert and cooperative. Normal mood and affect  Assessment & Plan Gastroesophageal reflux disease with dysphagia Chronic, progressively worsening gastroesophageal reflux disease with frequent transient esophageal food impactions, likely secondary to stricture for which she had prior esophageal dilation in 2013. Current management with famotidine provides partial relief. Further evaluation is deferred until resolution of acute respiratory illness, but she will be scheduled for EGD. - Continued famotidine OTC/20 mg, up to twice daily as needed for symptom control. - Discussed safety and long-term use of famotidine, emphasizing its favorable side effect profile. - Advised avoidance of dietary triggers, including tomatoes and spicy  foods. - Scheduled upper endoscopy with possible esophageal dilation for early March, contingent on recovery from current illness.  Scheduled with Dr. Nandigam.  The risks, benefits, and alternatives to EGD with dilation were discussed with the patient and she consents to proceed.  **The risks, benefits, and alternatives to EGD with dilation were discussed with the patient and she consents to proceed.   CC:  Larnell Hamilton, MD       [1]  Allergies Allergen Reactions   Erythromycin     Stomach Distress   Penicillins     REACTION: RASH   Tetracycline     REACTION: Raging Yeast Infections   "

## 2024-04-26 NOTE — Patient Instructions (Signed)
 Please purchase the following medications over the counter and take as directed: Famotidine 20 mg twice daily.   You have been scheduled for an endoscopy. Please follow written instructions given to you at your visit today.  If you use inhalers (even only as needed), please bring them with you on the day of your procedure.  If you take any of the following medications, they will need to be adjusted prior to your procedure:   DO NOT TAKE 7 DAYS PRIOR TO TEST- Trulicity (dulaglutide) Ozempic, Wegovy (semaglutide) Mounjaro, Zepbound (tirzepatide) Bydureon Bcise (exanatide extended release)  DO NOT TAKE 1 DAY PRIOR TO YOUR TEST Rybelsus (semaglutide) Adlyxin (lixisenatide) Victoza (liraglutide) Byetta (exanatide) ___________________________________________________________________________

## 2024-07-03 ENCOUNTER — Encounter: Admitting: Gastroenterology

## 2025-01-13 ENCOUNTER — Ambulatory Visit: Admitting: Obstetrics and Gynecology
